# Patient Record
Sex: Female | Born: 1960 | Race: White | Hispanic: No | Smoking: Current every day smoker
Health system: Southern US, Community
[De-identification: ages and names within clinical notes are randomized; demographics above are authoritative.]

## PROBLEM LIST (undated history)

## (undated) DIAGNOSIS — E785 Hyperlipidemia, unspecified: Secondary | ICD-10-CM

## (undated) DIAGNOSIS — I10 Essential (primary) hypertension: Secondary | ICD-10-CM

## (undated) DIAGNOSIS — J45909 Unspecified asthma, uncomplicated: Secondary | ICD-10-CM

## (undated) DIAGNOSIS — T7840XA Allergy, unspecified, initial encounter: Secondary | ICD-10-CM

## (undated) HISTORY — PX: EYE SURGERY: SHX253

## (undated) HISTORY — DX: Unspecified asthma, uncomplicated: J45.909

## (undated) HISTORY — DX: Allergy, unspecified, initial encounter: T78.40XA

## (undated) HISTORY — PX: AUGMENTATION MAMMAPLASTY: SUR837

## (undated) HISTORY — DX: Hyperlipidemia, unspecified: E78.5

## (undated) HISTORY — DX: Essential (primary) hypertension: I10

---

## 2003-12-11 ENCOUNTER — Other Ambulatory Visit: Payer: Self-pay

## 2004-11-02 ENCOUNTER — Emergency Department: Payer: Self-pay | Admitting: Unknown Physician Specialty

## 2005-07-06 ENCOUNTER — Emergency Department: Payer: Self-pay | Admitting: Emergency Medicine

## 2006-01-01 ENCOUNTER — Inpatient Hospital Stay (HOSPITAL_COMMUNITY): Admission: AD | Admit: 2006-01-01 | Discharge: 2006-01-01 | Payer: Self-pay | Admitting: Obstetrics and Gynecology

## 2006-01-08 ENCOUNTER — Inpatient Hospital Stay (HOSPITAL_COMMUNITY): Admission: AD | Admit: 2006-01-08 | Discharge: 2006-01-08 | Payer: Self-pay | Admitting: Family Medicine

## 2006-01-19 ENCOUNTER — Other Ambulatory Visit: Admission: RE | Admit: 2006-01-19 | Discharge: 2006-01-19 | Payer: Self-pay | Admitting: Obstetrics and Gynecology

## 2006-01-19 ENCOUNTER — Ambulatory Visit: Payer: Self-pay | Admitting: Obstetrics and Gynecology

## 2006-01-28 ENCOUNTER — Ambulatory Visit (HOSPITAL_COMMUNITY): Admission: RE | Admit: 2006-01-28 | Discharge: 2006-01-28 | Payer: Self-pay | Admitting: Obstetrics and Gynecology

## 2006-12-07 ENCOUNTER — Ambulatory Visit: Payer: Self-pay | Admitting: *Deleted

## 2009-02-28 ENCOUNTER — Emergency Department: Payer: Self-pay | Admitting: Emergency Medicine

## 2014-05-26 ENCOUNTER — Emergency Department: Payer: Self-pay | Admitting: Emergency Medicine

## 2014-06-10 ENCOUNTER — Emergency Department: Payer: Self-pay | Admitting: Emergency Medicine
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
## 2016-06-28 ENCOUNTER — Emergency Department
Admission: EM | Admit: 2016-06-28 | Discharge: 2016-06-28 | Disposition: A | Discharge status: Home / Self Care | Payer: Self-pay | Attending: Emergency Medicine | Admitting: Emergency Medicine

## 2016-06-28 ENCOUNTER — Emergency Department: Payer: Self-pay

## 2016-06-28 ENCOUNTER — Encounter: Payer: Self-pay | Admitting: Emergency Medicine

## 2016-06-28 DIAGNOSIS — R0789 Other chest pain: Secondary | ICD-10-CM | POA: Insufficient documentation

## 2016-06-28 DIAGNOSIS — F17210 Nicotine dependence, cigarettes, uncomplicated: Secondary | ICD-10-CM | POA: Insufficient documentation

## 2016-06-28 LAB — BASIC METABOLIC PANEL
Anion gap: 7 (ref 5–15)
BUN: 8 mg/dL (ref 6–20)
CALCIUM: 9.4 mg/dL (ref 8.9–10.3)
CO2: 25 mmol/L (ref 22–32)
CREATININE: 0.88 mg/dL (ref 0.44–1.00)
Chloride: 106 mmol/L (ref 101–111)
GFR calc Af Amer: >60 mL/min (ref >60)
GLUCOSE: 157 mg/dL — AB (ref 65–99)
Potassium: 4.2 mmol/L (ref 3.5–5.1)
Sodium: 138 mmol/L (ref 135–145)

## 2016-06-28 LAB — CBC WITH DIFFERENTIAL/PLATELET
BASOS ABS: 0.1 10*3/uL (ref 0–0.1)
BASOS PCT: 1 %
EOS PCT: 5 %
Eosinophils Absolute: 0.6 10*3/uL (ref 0–0.7)
HEMATOCRIT: 56.3 % — AB (ref 35.0–47.0)
Hemoglobin: 19.5 g/dL — ABNORMAL HIGH (ref 12.0–16.0)
Lymphocytes Relative: 16 %
Lymphs Abs: 1.7 10*3/uL (ref 1.0–3.6)
MCH: 33.5 pg (ref 26.0–34.0)
MCHC: 34.7 g/dL (ref 32.0–36.0)
MCV: 96.7 fL (ref 80.0–100.0)
MONO ABS: 0.7 10*3/uL (ref 0.2–0.9)
MONOS PCT: 6 %
NEUTROS ABS: 7.6 10*3/uL — AB (ref 1.4–6.5)
Neutrophils Relative %: 72 %
PLATELETS: 240 10*3/uL (ref 150–440)
RBC: 5.82 MIL/uL — ABNORMAL HIGH (ref 3.80–5.20)
RDW: 13.1 % (ref 11.5–14.5)
WBC: 10.6 10*3/uL (ref 3.6–11.0)

## 2016-06-28 LAB — FIBRIN DERIVATIVES D-DIMER (ARMC ONLY): Fibrin derivatives D-dimer (ARMC): 391 (ref 0–499)

## 2016-06-28 LAB — TROPONIN I: Troponin I: <0.03 ng/mL (ref <0.03)

## 2016-06-28 MED ORDER — HYDROCODONE-ACETAMINOPHEN 5-325 MG PO TABS
1.0000 | ORAL_TABLET | Freq: Once | ORAL | Status: AC
  Administered 2016-06-28: 1 via ORAL
  Filled 2016-06-28: qty 1

## 2016-06-28 MED ORDER — HYDROCODONE-ACETAMINOPHEN 5-325 MG PO TABS: 1.0000 | ORAL_TABLET | Freq: Four times a day (QID) | ORAL | 0 refills | Status: DC | PRN

## 2016-06-28 MED ORDER — IBUPROFEN 400 MG PO TABS: 400.0000 mg | ORAL_TABLET | Freq: Four times a day (QID) | ORAL | 0 refills | Status: DC | PRN

## 2016-06-28 NOTE — Discharge Instructions (Signed)
Please return immediately if condition worsens. Please contact her primary physician or the physician you were given for referral. If you have any specialist physicians involved in her treatment and plan please also contact them. Thank you for using [facility]. ° °

## 2016-06-28 NOTE — ED Notes (Signed)
D&C IV. Tolerated well. 

## 2016-06-28 NOTE — ED Triage Notes (Signed)
Pt c/o acute onset left lung pain started last night, pt states she can't even take a deep breath in without sharp severe pain. Pt denies any cough or any other symptoms.

## 2016-06-28 NOTE — ED Provider Notes (Signed)
Time Seen: Approximately 1307  I have reviewed the triage notes  Chief Complaint: Breathing Problem (hurts to breathe)   History of Present Illness: Alexis Parks is a 55 y.o. female *who presents with acute onset starting last night of some left lateral pleuritic chest pain. Patient states the pain seems to be with deep inspiration and denies much in way of pain with movement. She denies any trauma or rashes. On further questioning that she was able to find a point tenderness over the left T4-T5 region. The patient denies any breast pain or masses. She denies any fever, chills with occasional dry nonproductive cough. She denies any back or flank pain. Outside of smoking she denies any pulmonary emboli risk factors. She denies any calf tenderness or swelling in the lower extremities   History reviewed. No pertinent past medical history.  There are no active problems to display for this patient.   History reviewed. No pertinent surgical history.  History reviewed. No pertinent surgical history.  Current Outpatient Rx  . Order #: [id number] Class: Print  . Order #: [id number] Class: Print    Allergies:  Review of patient's allergies indicates no known allergies.  Family History: No family history on file.  Social History: Social History  Substance Use Topics  . Smoking status: Current Every Day Smoker    Packs/day: 1.00    Years: 20.00    Types: Cigarettes  . Smokeless tobacco: Never Used     Comment: "I am trying to quit"  . Alcohol use Yes     Comment: only weekends     Review of Systems:   10 point review of systems was performed and was otherwise negative:  Constitutional: No fever Eyes: No visual disturbances ENT: No sore throat, ear pain Cardiac: Chest pain is left lateral posterior to the axillary area again T4-T5 Respiratory: No shortness of breath, wheezing, or stridor Abdomen: No abdominal pain, no vomiting, No diarrhea Endocrine: No weight loss, No  night sweats Extremities: No peripheral edema, cyanosis Skin: No rashes, easy bruising Neurologic: No focal weakness, trouble with speech or swollowing Urologic: No dysuria, Hematuria, or urinary frequency  Physical Exam:  ED Triage Vitals  Enc Vitals Group     BP 06/28/16 1003 (!) 148/95     Pulse Rate 06/28/16 1003 (!) 106     Resp 06/28/16 1003 18     Temp 06/28/16 1003 98 F (36.7 C)     Temp Source 06/28/16 1003 Oral     SpO2 06/28/16 1003 94 %     Weight 06/28/16 1004 125 lb (56.7 kg)     Height 06/28/16 1004 5\' 3"  (1.6 m)     Head Circumference --      Peak Flow --      Pain Score 06/28/16 1008 10     Pain Loc --      Pain Edu? --      Excl. in GC? --     General: Awake , Alert , and Oriented times 3; GCS 15 Head: Normal cephalic , atraumatic Eyes: Pupils equal , round, reactive to light Nose/Throat: No nasal drainage, patent upper airway without erythema or exudate.  Neck: Supple, Full range of motion, No anterior adenopathy or palpable thyroid masses Lungs: Clear to ascultation without wheezes , rhonchi, or rales Heart: Regular rate, regular rhythm without murmurs , gallops , or rubs Abdomen: Soft, non tender without rebound, guarding , or rigidity; bowel sounds positive and symmetric in all 4 quadrants. No organomegaly .  Extremities: 2 plus symmetric pulses. No edema, clubbing or cyanosis Neurologic: normal ambulation, Motor symmetric without deficits, sensory intact Skin: warm, dry, no rashes Pain is reproducible with palpation across the posterior rib cage without any visible rashes or lesions and no crepitus or step-off noted  Labs:   All laboratory work was reviewed including any pertinent negatives or positives listed below:  Labs Reviewed  CBC WITH DIFFERENTIAL/PLATELET - Abnormal; Notable for the following:       Result Value   RBC 5.82 (*)    Hemoglobin 19.5 (*)    HCT 56.3 (*)    Neutro Abs 7.6 (*)    All other components within normal  limits  BASIC METABOLIC PANEL - Abnormal; Notable for the following:    Glucose, Bld 157 (*)    All other components within normal limits  FIBRIN DERIVATIVES D-DIMER ([facility] ONLY)  TROPONIN I    EKG: * ED ECG REPORT I, Jennye Moccasin, the attending physician, personally viewed and interpreted this ECG.  Date: 06/28/2016 EKG Time: *1018 Rate: *90 Rhythm: normal sinus rhythm QRS Axis: Rightward axis Intervals: normal ST/T Wave abnormalities: Nonspecific ST-T wave abnormality versus artifact Conduction Disturbances: none Narrative Interpretation: unremarkable No acute ischemic changes   Radiology: "Dg Chest 2 View  Result Date: 06/28/2016 CLINICAL DATA:  55 year old female with history of left-sided chest pain, worsening during deep inspiration since yesterday. Smoker. History of COPD. EXAM: CHEST  2 VIEW COMPARISON:  Chest x-ray a 06/10/2014. FINDINGS: Lung volumes are normal. No consolidative airspace disease. No pleural effusions. No pneumothorax. No pulmonary nodule or mass noted. Pulmonary vasculature and the cardiomediastinal silhouette are within normal limits. Aortic atherosclerosis. Bilateral breast implants with capsular calcification incidentally noted. IMPRESSION: 1. No radiographic evidence of acute cardiopulmonary disease. 2. An Aortic atherosclerosis. Electronically Signed   By: Trudie Reed M.D.   On: 06/28/2016 10:47  "  I personally reviewed the radiologic studies    ED Course: Differential includes all life-threatening causes for chest pain. This includes but is not exclusive to acute coronary syndrome, aortic dissection, pulmonary embolism, cardiac tamponade, community-acquired pneumonia, pericarditis, musculoskeletal chest wall pain, etc.  Given the patient's clinical presentation and objective findings I felt this was musculoskeletal lateral chest discomfort. With a negative D-dimer test and a low clinical suspicion for pulmonary embolism I felt we could  stop the evaluation at this point due to his significant sensitivity of a D-dimer test. She also doesn't appear to have persistent symptoms and denies any shortness of breath and given the reproducible nature of her pain I felt was unlikely to be significant intrathoracic discomfort from acute pulmonary embolism, aortic dissection, cardiac tamponade, acute coronary syndrome etc. Patient was advised her workup is not complete and she should follow up with her primary physician. She is currently in between insurance and programs and does not have a primary physician nor insurance at this point. I referred her to [facility]. She was given prescription pain medication. Clinical Course     Assessment: Acute chest wall pain Final Clinical Impression:  Final diagnoses:  Acute chest wall pain     Plan: Outpatient " Discharge Medication List as of 06/28/2016  3:07 PM    START taking these medications   Details  HYDROcodone-acetaminophen (NORCO) 5-325 MG tablet Take 1 tablet by mouth every 6 (six) hours as needed for moderate pain., Starting Mon 06/28/2016, Print    ibuprofen (ADVIL,MOTRIN) 400 MG tablet Take 1 tablet (400 mg total) by mouth every  6 (six) hours as needed., Starting Mon 06/28/2016, Print      " Patient was advised to return immediately if condition worsens. Patient was advised to follow up with their primary care physician or other specialized physicians involved in their outpatient care. The patient and/or family member/power of attorney had laboratory results reviewed at the bedside. All questions and concerns were addressed and appropriate discharge instructions were distributed by the nursing staff.             Jennye MoccasinBrian S Quigley, MD 06/28/16 [phone]

## 2016-06-28 NOTE — ED Notes (Signed)
No changes in symptoms

## 2016-10-13 ENCOUNTER — Ambulatory Visit
Admission: RE | Admit: 2016-10-13 | Discharge: 2016-10-13 | Disposition: A | Discharge status: Home / Self Care | Payer: Self-pay | Source: Ambulatory Visit | Attending: Oncology | Admitting: Oncology

## 2016-10-13 ENCOUNTER — Other Ambulatory Visit: Payer: Self-pay | Admitting: *Deleted

## 2016-10-13 ENCOUNTER — Encounter: Payer: Self-pay | Admitting: *Deleted

## 2016-10-13 ENCOUNTER — Ambulatory Visit: Payer: Self-pay | Attending: Oncology | Admitting: *Deleted

## 2016-10-13 VITALS — BP 136/86 | HR 103 | Temp 98.6°F | Ht 64.96 in | Wt 132.8 lb

## 2016-10-13 DIAGNOSIS — N63 Unspecified lump in unspecified breast: Secondary | ICD-10-CM

## 2016-10-13 DIAGNOSIS — Z0000 Encounter for general adult medical examination without abnormal findings: Secondary | ICD-10-CM

## 2016-10-13 NOTE — Patient Instructions (Signed)
HPV Test The human papillomavirus (HPV) test is used to look for high-risk types of HPV infection. HPV is a group of about 100 viruses. Many of these viruses cause growths on, in, or around the genitals. Most HPV viruses cause infections that usually go away without treatment. However, HPV types 6, 11, 16, and 18 are considered high-risk types of HPV that can increase your risk of cancer of the cervix or anus if the infection is left untreated. An HPV test identifies the DNA (genetic) strands of the HPV infection, so it is also referred to as the HPV DNA test. Although HPV is found in both males and females, the HPV test is only used to screen for increased cancer risk in females:  With an abnormal Pap test.  After treatment of an abnormal Pap test.  Between the ages of 30 and 65.  After treatment of a high-risk HPV infection. The HPV test may be done at the same time as a pelvic exam and Pap test in females over the age of 30. Both the HPV test and Pap test require a sample of cells from the cervix. How do I prepare for this test?  Do not douche or take a bath for 24-48 hours before the test or as directed by your health care provider.  Do not have sex for 24-48 hours before the test or as directed by your health care provider.  You may be asked to reschedule the test if you are menstruating.  You will be asked to urinate before the test. What do the results mean? It is your responsibility to obtain your test results. Ask the lab or department performing the test when and how you will get your results. Talk with your health care provider if you have any questions about your results. Your result will be negative or positive. Meaning of Negative Test Results  A negative HPV test result means that no HPV was found, and it is very likely that you do not have HPV. Meaning of Positive Test Results  A positive HPV test result indicates that you have HPV.  If your test result shows the presence  of any high-risk HPV strains, you may have an increased risk of developing cancer of the cervix or anus if the infection is left untreated.  If any low-risk HPV strains are found, you are not likely to have an increased risk of cancer. Discuss your test results with your health care provider. He or she will use the results to make a diagnosis and determine a treatment plan that is right for you. Talk with your health care provider to discuss your results, treatment options, and if necessary, the need for more tests. Talk with your health care provider if you have any questions about your results. This information is not intended to replace advice given to you by your health care provider. Make sure you discuss any questions you have with your health care provider. Document Released: 10/01/2004 Document Revised: 05/12/2016 Document Reviewed: 01/22/2014 Elsevier Interactive Patient Education  2017 Elsevier Inc.  Gave patient hand-out, Women Staying Healthy, Active and Well from [facility], with education on breast health, pap smears, heart and colon health.  

## 2016-10-13 NOTE — Progress Notes (Signed)
Subjective:     Patient ID: Alexis Parks, female   DOB: 21-Apr-1961, 56 y.o.   MRN: [id number]  HPI   Review of Systems     Objective:   Physical Exam  Pulmonary/Chest: Right breast exhibits mass. Right breast exhibits no inverted nipple, no nipple discharge, no skin change and no tenderness. Left breast exhibits no inverted nipple, no mass, no nipple discharge, no skin change and no tenderness. Breasts are symmetrical.    Bilateral breast with implants       Assessment:     56 year old White female referred to [facility] by [facility] evaluation of a right breast mass.  Patient with family history of a maternal first cousin with ovarian cancer, and a brother with brain cancer.  Patient states she found the lump 3 months ago on self exam after noticing tenderness while putting on her bra.  States she has not noticed any changes in the last the 3 months.  On clinical breast exam I can palpate a tiny very tender, superficial, approximate < 0.5 cm nodule at 8:00 right breast.  The bilateral implants are palpable.  Patient states she has had the implants for 25 years.  Taught self breast awareness.  Specimen collected for pap smear without difficulty.  Patient has been screened for eligibility.  She does not have any insurance, Medicare or Medicaid.  She also meets financial eligibility.  Hand-out given on the Affordable Care Act.    Plan:     Bilateral diagnostic mammogram and ultrasound ordered.  Specimen for pap sent to the lab.  Will follow-up per [facility] protocol.

## 2016-10-15 ENCOUNTER — Telehealth: Payer: Self-pay | Admitting: *Deleted

## 2016-10-15 NOTE — Telephone Encounter (Signed)
Left patient a message to give me a call.  She had a normal mammogram result, but I would like to offer her a surgical consult for the palpable nodule in the right breast.

## 2016-10-19 LAB — PAP LB AND HPV HIGH-RISK
HPV, high-risk: NEGATIVE
PAP SMEAR COMMENT: 0

## 2016-10-20 ENCOUNTER — Ambulatory Visit (INDEPENDENT_AMBULATORY_CARE_PROVIDER_SITE_OTHER): Payer: PRIVATE HEALTH INSURANCE | Admitting: General Surgery

## 2016-10-20 ENCOUNTER — Inpatient Hospital Stay: Payer: Self-pay

## 2016-10-20 ENCOUNTER — Encounter: Payer: Self-pay | Admitting: General Surgery

## 2016-10-20 VITALS — BP 140/82 | HR 86 | Resp 14 | Ht 64.0 in | Wt 133.0 lb

## 2016-10-20 DIAGNOSIS — N6310 Unspecified lump in the right breast, unspecified quadrant: Secondary | ICD-10-CM

## 2016-10-20 NOTE — Progress Notes (Addendum)
Patient ID: [name], female   DOB: 10/23/1960, 56 y.o.   MRN: [id number]  Chief Complaint  Patient presents with  . Breast Problem    HPI Alexis Parks is a 56 y.o. female.  who presents for a breast evaluation referred by [facility]. The most recent mammogram and ultrasound was done on 10-13-16. She states she felt a lump in the right breast about 3 months ago. Admits to tenderness. She does not think it has changed in size, about a green pea. Patient does not perform regular self breast checks and does not gets regular mammograms done.   She is here with Alexis Parks her sister. I have reviewed the history of present illness with the patient.   HPI  Past Medical History:  Diagnosis Date  . Allergy    seasonal    Past Surgical History:  Procedure Laterality Date  . AUGMENTATION MAMMAPLASTY Bilateral 25+ yrs ago   silicone    Family History  Problem Relation Age of Onset  . Bone cancer Father   . Diabetes Maternal Grandfather     Social History Social History  Substance Use Topics  . Smoking status: Current Every Day Smoker    Packs/day: 1.00    Years: 20.00    Types: Cigarettes  . Smokeless tobacco: Never Used     Comment: "I am trying to quit"  . Alcohol use Yes     Comment: only weekends    No Known Allergies  Current Outpatient Prescriptions  Medication Sig Dispense Refill  . Aspirin Effervescent (ALKA-SELTZER EXTRA STRENGTH PO) Take by mouth as needed.    Marland Kitchen ibuprofen (ADVIL,MOTRIN) 400 MG tablet Take 1 tablet (400 mg total) by mouth every 6 (six) hours as needed. 30 tablet 0   No current facility-administered medications for this visit.     Review of Systems Review of Systems  Constitutional: Negative.   Respiratory: Negative.   Cardiovascular: Negative.     Blood pressure 140/82, pulse 86, resp. rate 14, height 5\' 4"  (1.626 m), weight 133 lb (60.3 kg).  Physical Exam Physical Exam  Constitutional: She is oriented to person, place, and time. She  appears well-developed and well-nourished.  Eyes: Conjunctivae are normal. No scleral icterus.  Neck: Neck supple.  Cardiovascular: Normal rate, regular rhythm and normal heart sounds.   Pulmonary/Chest: Effort normal and breath sounds normal. Right breast exhibits mass and tenderness. Right breast exhibits no inverted nipple, no nipple discharge and no skin change. Left breast exhibits no inverted nipple, no mass, no nipple discharge, no skin change and no tenderness.    Lymphadenopathy:    She has no cervical adenopathy.    She has no axillary adenopathy.  Neurological: She is alert and oriented to person, place, and time.  Skin: Skin is warm and dry.  Psychiatric: Her behavior is normal.    Data Reviewed Mammogram and ultrasound reviewed no findings in the area of patient's concern Assessment    5 mm mass right breast, located at 7-8 OCL position, 4 CMFN. Given stability and negative imaging this is likely a benign mass-possibly fat necrosis.  No apparent deformity of breast implants noted on physical exam.    Plan    Mass was not located on mammogram or ultrasound.  Safe to follow, pt advised  Instructed patient to monitor size of lump, and call back if it increases in size or becomes more painful. Follow-up in 3 months.     This information has been scribed by Dorathy Daft  RN, BSN,BC.   SANKAR,SEEPLAPUTHUR G 10/20/2016, 1:22 PM

## 2016-10-20 NOTE — Patient Instructions (Signed)
The patient is aware to call back for any questions or concerns.  

## 2016-10-26 ENCOUNTER — Encounter: Payer: Self-pay | Admitting: *Deleted

## 2016-10-26 NOTE — Progress Notes (Signed)
Probable benign findings per Dr. Evette CristalSankar.  Patient is to follow up with him in 3 months.  [facility] to [place].

## 2017-01-13 ENCOUNTER — Encounter: Payer: Self-pay | Admitting: General Surgery

## 2017-01-13 ENCOUNTER — Ambulatory Visit (INDEPENDENT_AMBULATORY_CARE_PROVIDER_SITE_OTHER): Payer: PRIVATE HEALTH INSURANCE | Admitting: General Surgery

## 2017-01-13 ENCOUNTER — Inpatient Hospital Stay: Payer: Self-pay

## 2017-01-13 VITALS — BP 128/78 | HR 82 | Resp 12 | Ht 64.0 in | Wt 123.0 lb

## 2017-01-13 DIAGNOSIS — N6310 Unspecified lump in the right breast, unspecified quadrant: Secondary | ICD-10-CM

## 2017-01-13 DIAGNOSIS — N6313 Unspecified lump in the right breast, lower outer quadrant: Secondary | ICD-10-CM | POA: Diagnosis not present

## 2017-01-13 NOTE — Progress Notes (Signed)
Patient ID: Alexis Parks, female   DOB: 11/28/1960, 56 y.o.   MRN: [id number]  Chief Complaint  Patient presents with  . Follow-up    breast mass    HPI Alexis Parks is a 56 y.o. female.  Here today for follow up right breast mass. She states it has not changed in size, but it is tender. She currently has a sinus headache.  HPI  Past Medical History:  Diagnosis Date  . Allergy    seasonal    Past Surgical History:  Procedure Laterality Date  . AUGMENTATION MAMMAPLASTY Bilateral 25+ yrs ago   silicone    Family History  Problem Relation Age of Onset  . Bone cancer Father   . Diabetes Maternal Grandfather     Social History Social History  Substance Use Topics  . Smoking status: Current Every Day Smoker    Packs/day: 1.00    Years: 20.00    Types: Cigarettes  . Smokeless tobacco: Never Used     Comment: "I am trying to quit"  . Alcohol use Yes     Comment: only weekends    No Known Allergies  Current Outpatient Prescriptions  Medication Sig Dispense Refill  . Aspirin Effervescent (ALKA-SELTZER EXTRA STRENGTH PO) Take by mouth as needed.    Marland Kitchen ibuprofen (ADVIL,MOTRIN) 400 MG tablet Take 1 tablet (400 mg total) by mouth every 6 (six) hours as needed. 30 tablet 0   No current facility-administered medications for this visit.     Review of Systems Review of Systems  Constitutional: Negative.   Respiratory: Negative.   Cardiovascular: Negative.   Neurological: Positive for headaches.    Blood pressure 128/78, pulse 82, resp. rate 12, height  (1.626 m), weight 123 lb (55.8 kg).  Physical Exam Physical Exam  Constitutional: She is oriented to person, place, and time. She appears well-developed and well-nourished.  Pulmonary/Chest: Right breast exhibits mass and tenderness. Right breast exhibits no inverted nipple, no nipple discharge and no skin change. Left breast exhibits no inverted nipple, no mass, no nipple discharge, no skin change and no  tenderness.    Lymphadenopathy:    She has no axillary adenopathy.  Neurological: She is alert and oriented to person, place, and time.  Skin: Skin is warm.  Psychiatric: Her behavior is normal.    Data Reviewed Prior notes reviewed Mammogram reviewed - stable Targeted ultrasound showed some irregularity of the right breast implant over the area of tenderness, 7-8 o'clock position, 4 CMFN. No mass/cyst was identified.  Assessment    5 mm mass right breast, located at 7-8 o'clock position, 4 CMFN. This remains unchanged. Ultrasound showed no apparent abnormality of the underlying tissue, but there is some surface irregularity of the implant. Patient advised, no need for further assessment or intervention.     Plan        Follow up as needed. Screening mammogram next year via [facility].  The patient is aware to call back for any questions or concerns.   HPI, Physical Exam, Assessment and Plan have been scribed under the direction and in the presence of Kathreen Cosier, MD  Dorathy Daft, RN  I have completed the exam and reviewed the above documentation for accuracy and completeness.  I agree with the above.  Museum/gallery conservator has been used and any errors in dictation or transcription are unintentional.  Kaloni Bisaillon G. Evette Cristal, M.D., F.A.C.S.  Gerlene Burdock G 01/13/2017, 3:33 PM

## 2017-01-13 NOTE — Patient Instructions (Signed)
The patient is aware to call back for any questions or concerns.  

## 2017-12-14 ENCOUNTER — Other Ambulatory Visit: Payer: Self-pay

## 2017-12-14 ENCOUNTER — Ambulatory Visit
Admission: RE | Admit: 2017-12-14 | Discharge: 2017-12-14 | Disposition: A | Discharge status: Home / Self Care | Payer: Self-pay | Source: Ambulatory Visit | Attending: Oncology | Admitting: Oncology

## 2017-12-14 ENCOUNTER — Ambulatory Visit: Payer: Self-pay | Attending: Oncology

## 2017-12-14 VITALS — BP 122/77 | HR 81 | Temp 98.3°F | Ht 65.0 in | Wt 125.0 lb

## 2017-12-14 DIAGNOSIS — Z0000 Encounter for general adult medical examination without abnormal findings: Secondary | ICD-10-CM

## 2017-12-14 NOTE — Progress Notes (Signed)
Subjective:     Patient ID: Alexis Parks, female   DOB: 07/10/1961, 57 y.o.   MRN: [id number]  HPI   Review of Systems     Objective:   Physical Exam  Pulmonary/Chest: Right breast exhibits no inverted nipple, no mass, no nipple discharge, no skin change and no tenderness. Left breast exhibits no inverted nipple, no mass, no nipple discharge, no skin change and no tenderness. Breasts are symmetrical.  Bilateral breast implants       Assessment:     57 [facility].  Patient screened, and meets [facility] eligibility.  Patient does not have insurance, Medicare or Medicaid.  Handout given on Affordable Care Act. Instructed patient on breast self awareness using teach back method. Per patient, she has had bilateral breast implants for 30 years.  CBE unremarkable. No mass or lump palpated.  She was followed by Dr. Evette CristalSankar  In 2018 for stable right breast nodule.    Plan:     Sent for bilateral screening mammogram.

## 2017-12-15 NOTE — Progress Notes (Signed)
Letter mailed from [facility] to notify of normal mammogram results.  Patient to return in one year for annual screening.  Copy to [facility]. 

## 2018-01-30 IMAGING — CR DG CHEST 2V
1 series · 2 of 2 positions shown · non-contrast
Comparison: Chest x-ray a 06/10/2014.

CLINICAL DATA: 55-year-old female with history of left-sided chest
pain, worsening during deep inspiration since yesterday. Smoker.
History of COPD.

EXAM:
CHEST  2 VIEW

[Series 1: w chest pa · 0.14mm/px · 2 of 2 slices shown]
[im 1/2]
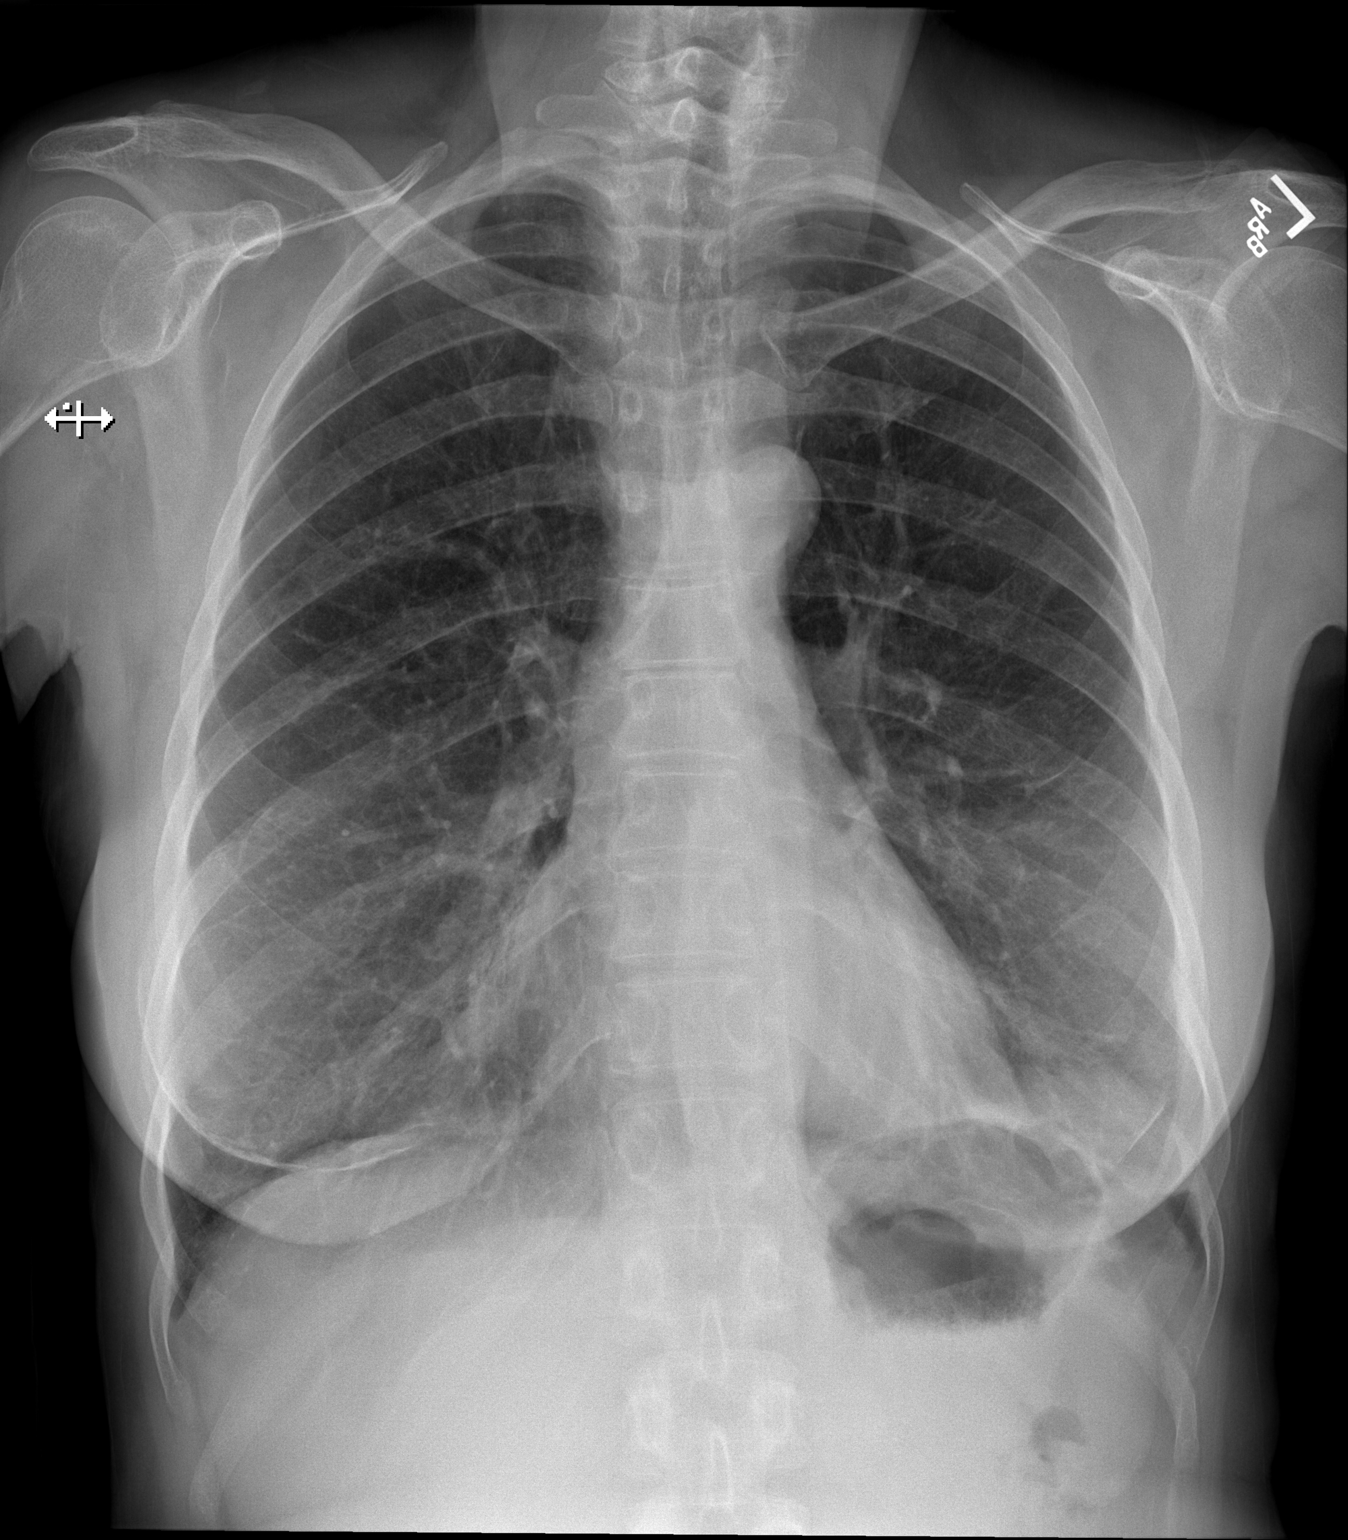
[im 2/2]
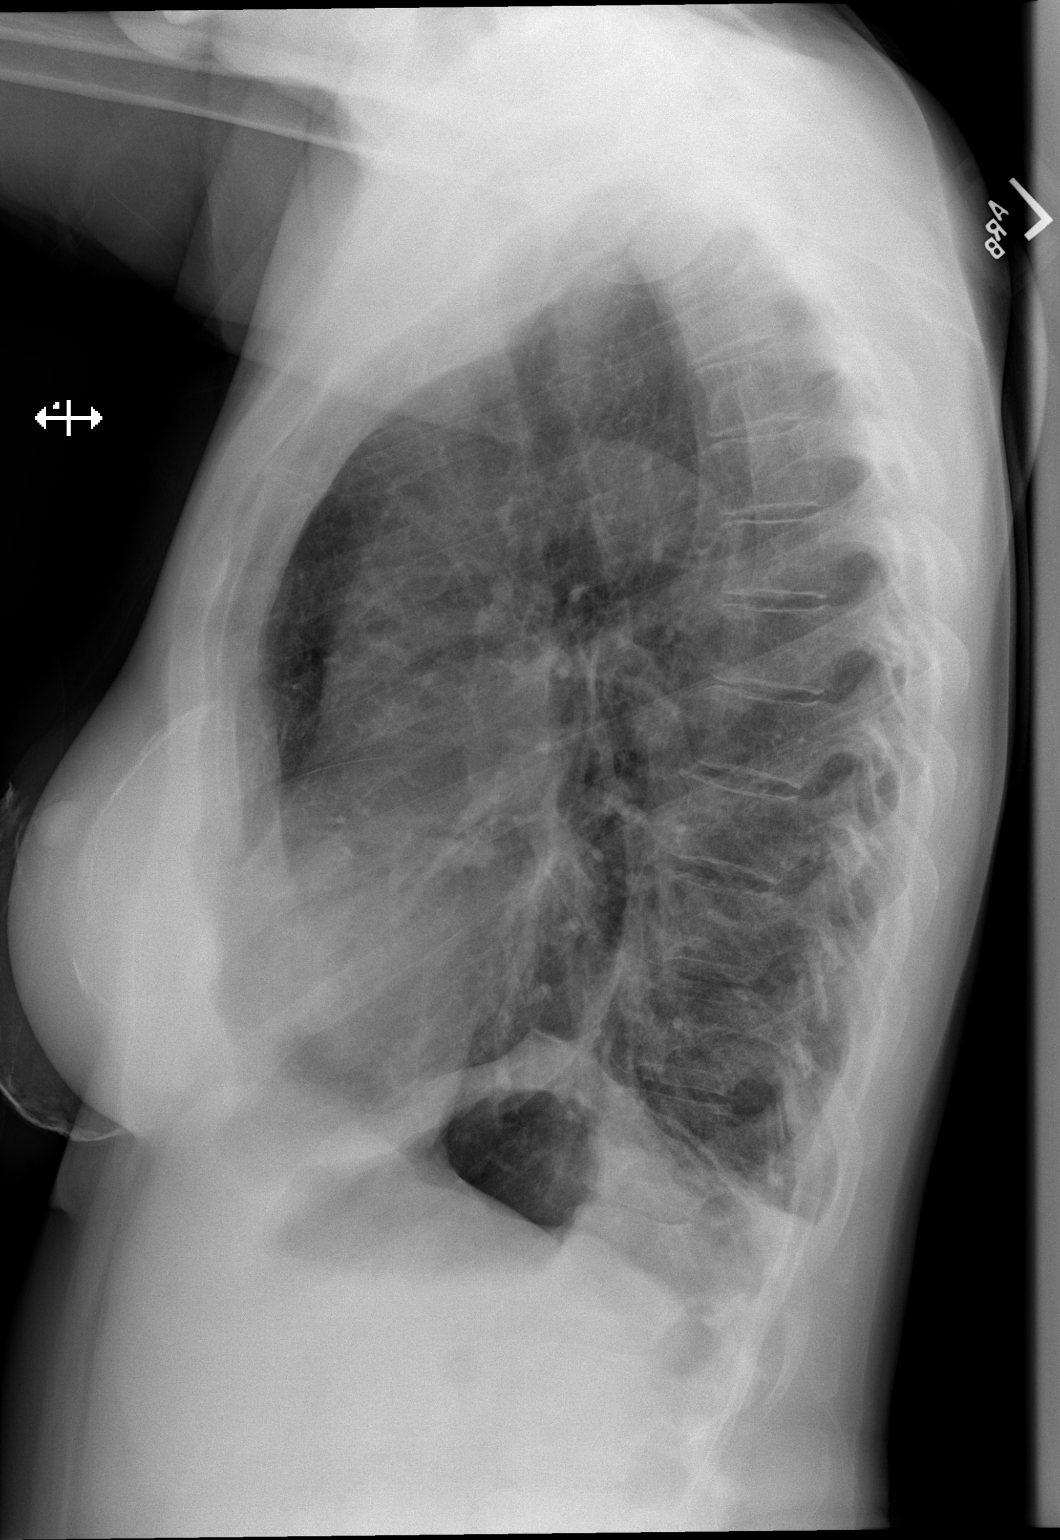

[2 of 2 positions shown; findings below may reference images not displayed]

FINDINGS: Lung volumes are normal. No consolidative airspace disease. No
pleural effusions. No pneumothorax. No pulmonary nodule or mass
noted. Pulmonary vasculature and the cardiomediastinal silhouette
are within normal limits. Aortic atherosclerosis. Bilateral breast
implants with capsular calcification incidentally noted.
IMPRESSION: 1. No radiographic evidence of acute cardiopulmonary disease.
2. An Aortic atherosclerosis.

## 2019-04-05 ENCOUNTER — Other Ambulatory Visit: Payer: Self-pay

## 2019-04-05 DIAGNOSIS — Z20822 Contact with and (suspected) exposure to covid-19: Secondary | ICD-10-CM

## 2019-04-10 LAB — NOVEL CORONAVIRUS, NAA: SARS-CoV-2, NAA: NOT DETECTED

## 2019-07-13 ENCOUNTER — Other Ambulatory Visit: Payer: Self-pay

## 2019-07-13 DIAGNOSIS — Z20822 Contact with and (suspected) exposure to covid-19: Secondary | ICD-10-CM

## 2019-07-15 LAB — NOVEL CORONAVIRUS, NAA: SARS-CoV-2, NAA: NOT DETECTED

## 2020-05-16 ENCOUNTER — Emergency Department: Payer: Self-pay

## 2020-05-16 ENCOUNTER — Emergency Department
Admission: EM | Admit: 2020-05-16 | Discharge: 2020-05-16 | Disposition: A | Discharge status: Home / Self Care | Payer: Self-pay | Attending: Emergency Medicine | Admitting: Emergency Medicine

## 2020-05-16 ENCOUNTER — Other Ambulatory Visit: Payer: Self-pay

## 2020-05-16 DIAGNOSIS — Z5321 Procedure and treatment not carried out due to patient leaving prior to being seen by health care provider: Secondary | ICD-10-CM | POA: Insufficient documentation

## 2020-05-16 DIAGNOSIS — R0602 Shortness of breath: Secondary | ICD-10-CM | POA: Insufficient documentation

## 2020-05-16 DIAGNOSIS — R05 Cough: Secondary | ICD-10-CM | POA: Insufficient documentation

## 2020-05-16 DIAGNOSIS — R82998 Other abnormal findings in urine: Secondary | ICD-10-CM | POA: Insufficient documentation

## 2020-05-16 LAB — URINALYSIS, COMPLETE (UACMP) WITH MICROSCOPIC
Bilirubin Urine: NEGATIVE
Glucose, UA: NEGATIVE mg/dL
Hgb urine dipstick: NEGATIVE
Ketones, ur: NEGATIVE mg/dL
Leukocytes,Ua: NEGATIVE
Nitrite: NEGATIVE
Protein, ur: NEGATIVE mg/dL
Specific Gravity, Urine: 1.003 — ABNORMAL LOW (ref 1.005–1.030)
Squamous Epithelial / HPF: NONE SEEN (ref 0–5)
pH: 6 (ref 5.0–8.0)

## 2020-05-16 NOTE — ED Notes (Addendum)
This RN was walking to pod D waiting area when pt and husband were talking towards discharge doors. This RN asked pt where she was going and pt stated, "I'm leaving, I'm done waiting." PA made aware. Unable to obtain VS or signature prior to leaving

## 2020-05-16 NOTE — ED Notes (Signed)
Pt states that she has had a cough, congestion of her check, n/v and some dark urine in the morning. Pt denies fevers.

## 2020-05-16 NOTE — ED Notes (Addendum)
Patient to waiting room ambulatory via [facility] from home.  Per EMS patient complains of shortness of six months, flank pain and dark urine for the past 2 months.  Per EMS vital signs are within normal limits.

## 2020-05-16 NOTE — ED Triage Notes (Signed)
Pt states has had an intermittent productive cough for months following covid infection. Pt states she is also concerned her urine is discolored for months. Pt denies pain. Appears in no acute distress.

## 2020-05-16 NOTE — ED Notes (Signed)
X-ray called patient with no answer.

## 2020-06-25 ENCOUNTER — Other Ambulatory Visit: Payer: Self-pay

## 2020-06-25 ENCOUNTER — Ambulatory Visit: Payer: Self-pay | Admitting: Gerontology

## 2020-06-25 ENCOUNTER — Other Ambulatory Visit: Payer: Self-pay | Admitting: Gerontology

## 2020-06-25 ENCOUNTER — Encounter: Payer: Self-pay | Admitting: Gerontology

## 2020-06-25 VITALS — BP 129/83 | HR 95 | Resp 18 | Ht 64.0 in | Wt 113.4 lb

## 2020-06-25 DIAGNOSIS — F17200 Nicotine dependence, unspecified, uncomplicated: Secondary | ICD-10-CM | POA: Insufficient documentation

## 2020-06-25 DIAGNOSIS — R059 Cough, unspecified: Secondary | ICD-10-CM | POA: Insufficient documentation

## 2020-06-25 DIAGNOSIS — I5022 Chronic systolic (congestive) heart failure: Secondary | ICD-10-CM

## 2020-06-25 DIAGNOSIS — Z8709 Personal history of other diseases of the respiratory system: Secondary | ICD-10-CM

## 2020-06-25 DIAGNOSIS — R5383 Other fatigue: Secondary | ICD-10-CM | POA: Insufficient documentation

## 2020-06-25 DIAGNOSIS — H538 Other visual disturbances: Secondary | ICD-10-CM | POA: Insufficient documentation

## 2020-06-25 DIAGNOSIS — Z7689 Persons encountering health services in other specified circumstances: Secondary | ICD-10-CM | POA: Insufficient documentation

## 2020-06-25 DIAGNOSIS — R399 Unspecified symptoms and signs involving the genitourinary system: Secondary | ICD-10-CM | POA: Insufficient documentation

## 2020-06-25 DIAGNOSIS — R2242 Localized swelling, mass and lump, left lower limb: Secondary | ICD-10-CM

## 2020-06-25 MED ORDER — FLUTICASONE-SALMETEROL 250-50 MCG/DOSE IN AEPB: 1.0000 | INHALATION_SPRAY | Freq: Every day | RESPIRATORY_TRACT | 3 refills | Status: DC

## 2020-06-25 MED ORDER — FEXOFENADINE HCL 30 MG PO TBDP: 30.0000 mg | ORAL_TABLET | Freq: Every day | ORAL | 3 refills | Status: DC

## 2020-06-25 MED ORDER — ALBUTEROL SULFATE HFA 108 (90 BASE) MCG/ACT IN AERS: 2.0000 | INHALATION_SPRAY | Freq: Four times a day (QID) | RESPIRATORY_TRACT | 3 refills | Status: DC | PRN

## 2020-06-25 NOTE — Progress Notes (Signed)
Patient ID: Alexis Parks, female   DOB: October 04, 1960, 59 y.o.   MRN: [id number]  Chief Complaint  Patient presents with  . Annual Exam    first visit    HPI Alexis Parks is a 59 y.o. female presenting today to establish care and address her medical problems. She states that she takes no meds daily. She reports a history of asthma diagnosed more than 15 years ago and was on albuterol and Allegra. She states that her symptoms were mostly controlled on those medications. She reports chest congestion and cough for "at least the last 4 months." Reports coughing up thick mucous that sometimes makes her gag. Denies post nasal drip or rhinorrhea. She received a chest X ray a the Emergency Department on 05/16/2020 that showed COPD without acute abnormality. She reports that she smokes a pack a day and is not ready to quit. Denies shortness of breath at rest, but states that she occasionally feels short of breath on exertion. She states that these symptoms are stable for her. She also reports that she thinks she has a bladder infection. She reports intermittent sharp pain with urination and reports that her urine is dark. Denies known hematuria or suprapubic pain. She states that she does not drink much water and drinks primarily [facility]. She reports a hx of cataract surgery in her R eye and reports that over the last few years that same eye has started to become blurry again. She denies pain or vision loss. States that L eye is unaffected. She states that she has been fatigued for more than 4 months now since she had COVID. She reports that she sleeps well at night and always gets at least 7 hours of sleep. She also reports a lump on her lower L shin for the last 8 months. States that it is tender. Denies injury. Lump is soft and spongy to palpation. Not easily moveable or discolored. Overall, patient states that she is doing well and offers no further complaint.   HPI  Past Medical History:    Diagnosis Date  . Allergy    seasonal  . Asthma     Past Surgical History:  Procedure Laterality Date  . AUGMENTATION MAMMAPLASTY Bilateral 53+ yrs ago   silicone  . EYE SURGERY     cataract surgery    Family History  Problem Relation Age of Onset  . Bone cancer Father   . Diabetes Maternal Grandfather     Social History Social History   Tobacco Use  . Smoking status: Current Every Day Smoker    Packs/day: 1.00    Years: 20.00    Pack years: 20.00    Types: Cigarettes  . Smokeless tobacco: Never Used  . Tobacco comment: "I am trying to quit"  Substance Use Topics  . Alcohol use: Yes    Alcohol/week: 2.0 - 3.0 standard drinks    Types: 2 - 3 Cans of beer per week    Comment: daily  . Drug use: No    No Known Allergies  Current Outpatient Medications  Medication Sig Dispense Refill  . acetaminophen (TYLENOL) 325 MG tablet Take 650 mg by mouth as needed for mild pain.    . Aspirin Effervescent (ALKA-SELTZER EXTRA STRENGTH PO) Take by mouth as needed.    Marland Kitchen dextromethorphan-guaiFENesin (MUCINEX DM) 30-600 MG 12hr tablet Take 1 tablet by mouth 2 (two) times daily as needed for cough.    Marland Kitchen ibuprofen (ADVIL,MOTRIN) 400 MG tablet Take 1 tablet (  400 mg total) by mouth every 6 (six) hours as needed. (Patient not taking: Reported on 06/25/2020) 30 tablet 0   No current facility-administered medications for this visit.    Review of Systems Review of Systems  Constitutional: Positive for fatigue.  HENT: Positive for congestion.   Eyes: Positive for visual disturbance.  Respiratory: Positive for cough.   Cardiovascular: Negative.   Gastrointestinal: Negative.   Genitourinary: Positive for dysuria and flank pain.  Musculoskeletal: Negative for arthralgias, gait problem and joint swelling.  Skin: Negative for color change and rash.       "bump on L shin"  Neurological: Negative.   Hematological: Negative.   Psychiatric/Behavioral: Negative.     Blood pressure  129/83, pulse 95, resp. rate 18, height _0  (1.626 m), weight 113 lb 6.4 oz (51.4 kg), SpO2 95 %.  Physical Exam Physical Exam Constitutional:      Appearance: Normal appearance.  HENT:     Head: Normocephalic.  Eyes:     Pupils: Pupils are equal, round, and reactive to light.  Cardiovascular:     Rate and Rhythm: Normal rate and regular rhythm.     Heart sounds: Normal heart sounds.  Pulmonary:     Effort: Pulmonary effort is normal.     Breath sounds: Normal breath sounds.  Abdominal:     General: Abdomen is flat. Bowel sounds are normal.     Palpations: Abdomen is soft.     Tenderness: There is right CVA tenderness and left CVA tenderness.  Musculoskeletal:        General: Normal range of motion.  Skin:    General: Skin is warm and dry.       Neurological:     General: No focal deficit present.     Mental Status: She is alert and oriented to person, place, and time.  Psychiatric:        Mood and Affect: Mood normal.        Behavior: Behavior normal.        Thought Content: Thought content normal.        Judgment: Judgment normal.    Assessment and Plan  1. Encounter to establish care Labs drawn today. Return 07/09/20 to review. Schedule colonoscopy and mammogram. Referrals provided.  - CBC w/Diff - Comp Met (CMET) - Lipid Profile - HgB A1c - Vitamin D (25 hydroxy) - TSH - B12 and Folate Panel - Ambulatory referral to Hematology / Oncology - Ambulatory referral to Gastroenterology  2. History of asthma Albuterol inhaler and Allegra refilled.  Avoid asthma triggers when possible. - albuterol (VENTOLIN HFA) 108 (90 Base) MCG/ACT inhaler; Inhale 2 puffs into the lungs every 6 (six) hours as needed for wheezing or shortness of breath.  Dispense: 18 g; Refill: 3 - fexofenadine (ALLEGRA ODT) 30 MG disintegrating tablet; Take 1 tablet (30 mg total) by mouth daily.  Dispense: 30 tablet; Refill: 3  3. Smoking Encouraged to quit smoking. Referral provided for low  dose CT lung cancer screening.  - Ambulatory referral to Hematology / Oncology  4. Fatigue, unspecified type Continue to rest when needed. Increase water intake. Eat a diet rich in lean meats, fruits and vegetables and whole grains.  Will follow up on lab results.  - CBC w/Diff - Vitamin D (25 hydroxy) - TSH  5. Symptoms of urinary tract infection Increase water intake. Will follow up on urinalysis results. - UA/M w/rflx Culture, Routine  6. Cough Continue OTC Mucinex for chest congestion and productive cough.  7. Lump of skin of lower extremity, left Monitor lump for increased pain, redness, or size change.  - Ambulatory referral to General Surgery  8. Blurry vision, right eye Make an appointment with [facility] Ophthalmology. Monitor for sudden vision loss, intense pain, or new or worsening symptoms.  - Ambulatory referral to Ophthalmology  9. History of COPD Start Advair daily as prescribed. Encouraged to stop smoking. - Fluticasone-Salmeterol (ADVAIR) 250-50 MCG/DOSE AEPB; Inhale 1 puff into the lungs daily.  Dispense: 60 each; Refill: 3  Return in about 2 weeks (around 07/09/2020), or if symptoms worsen or fail to improve.  Maylon Cos Jabe Jeanbaptiste 06/25/2020, 11:40 AM

## 2020-06-28 LAB — COMPREHENSIVE METABOLIC PANEL
ALT: 30 IU/L (ref 0–32)
AST: 51 IU/L — ABNORMAL HIGH (ref 0–40)
Albumin/Globulin Ratio: 1.4 (ref 1.2–2.2)
Albumin: 4.4 g/dL (ref 3.8–4.9)
Alkaline Phosphatase: 147 IU/L — ABNORMAL HIGH (ref 44–121)
BUN/Creatinine Ratio: 8 — ABNORMAL LOW (ref 9–23)
BUN: 6 mg/dL (ref 6–24)
Bilirubin Total: 0.5 mg/dL (ref 0.0–1.2)
CO2: 23 mmol/L (ref 20–29)
Calcium: 10.6 mg/dL — ABNORMAL HIGH (ref 8.7–10.2)
Chloride: 105 mmol/L (ref 96–106)
Creatinine, Ser: 0.79 mg/dL (ref 0.57–1.00)
GFR calc Af Amer: 96 mL/min/{1.73_m2} (ref >59)
GFR calc non Af Amer: 83 mL/min/{1.73_m2} (ref >59)
Globulin, Total: 3.2 g/dL (ref 1.5–4.5)
Glucose: 94 mg/dL (ref 65–99)
Potassium: 5.6 mmol/L — ABNORMAL HIGH (ref 3.5–5.2)
Sodium: 144 mmol/L (ref 134–144)
Total Protein: 7.6 g/dL (ref 6.0–8.5)

## 2020-06-28 LAB — CBC WITH DIFFERENTIAL/PLATELET
Basophils Absolute: 0.1 10*3/uL (ref 0.0–0.2)
Basos: 1 %
EOS (ABSOLUTE): 0.2 10*3/uL (ref 0.0–0.4)
Eos: 3 %
Hematocrit: 49 % — ABNORMAL HIGH (ref 34.0–46.6)
Hemoglobin: 17 g/dL — ABNORMAL HIGH (ref 11.1–15.9)
Immature Grans (Abs): 0 10*3/uL (ref 0.0–0.1)
Immature Granulocytes: 0 %
Lymphocytes Absolute: 1.7 10*3/uL (ref 0.7–3.1)
Lymphs: 20 %
MCH: 35.2 pg — ABNORMAL HIGH (ref 26.6–33.0)
MCHC: 34.7 g/dL (ref 31.5–35.7)
MCV: 101 fL — ABNORMAL HIGH (ref 79–97)
Monocytes Absolute: 0.7 10*3/uL (ref 0.1–0.9)
Monocytes: 9 %
Neutrophils Absolute: 5.7 10*3/uL (ref 1.4–7.0)
Neutrophils: 67 %
Platelets: 320 10*3/uL (ref 150–450)
RBC: 4.83 x10E6/uL (ref 3.77–5.28)
RDW: 11.6 % — ABNORMAL LOW (ref 11.7–15.4)
WBC: 8.4 10*3/uL (ref 3.4–10.8)

## 2020-06-28 LAB — HEMOGLOBIN A1C
Est. average glucose Bld gHb Est-mCnc: 97 mg/dL
Hgb A1c MFr Bld: 5 % (ref 4.8–5.6)

## 2020-06-28 LAB — LIPID PANEL
Chol/HDL Ratio: 2.3 ratio (ref 0.0–4.4)
Cholesterol, Total: 216 mg/dL — ABNORMAL HIGH (ref 100–199)
HDL: 94 mg/dL (ref >39)
LDL Chol Calc (NIH): 106 mg/dL — ABNORMAL HIGH (ref 0–99)
Triglycerides: 95 mg/dL (ref 0–149)
VLDL Cholesterol Cal: 16 mg/dL (ref 5–40)

## 2020-06-28 LAB — VITAMIN D 25 HYDROXY (VIT D DEFICIENCY, FRACTURES): Vit D, 25-Hydroxy: 25.7 ng/mL — ABNORMAL LOW (ref 30.0–100.0)

## 2020-06-28 LAB — UA/M W/RFLX CULTURE, ROUTINE

## 2020-06-28 LAB — B12 AND FOLATE PANEL
Folate: 11.6 ng/mL (ref >3.0)
Vitamin B-12: 1129 pg/mL (ref 232–1245)

## 2020-06-28 LAB — TSH: TSH: 1.12 u[IU]/mL (ref 0.450–4.500)

## 2020-07-01 ENCOUNTER — Other Ambulatory Visit: Payer: Self-pay

## 2020-07-01 ENCOUNTER — Ambulatory Visit: Payer: Self-pay | Admitting: Pharmacy Technician

## 2020-07-01 ENCOUNTER — Telehealth: Payer: Self-pay

## 2020-07-01 DIAGNOSIS — Z79899 Other long term (current) drug therapy: Secondary | ICD-10-CM

## 2020-07-01 NOTE — Progress Notes (Signed)
Patient rescheduled for July 02, 2020 at 11:00a.m. to come in to [facility] to sign contract, [facility] Attestation, patient advocate letter and PAP applications.  Sherilyn Dacosta Care Manager [facility]

## 2020-07-01 NOTE — Telephone Encounter (Signed)
Contacted patient for lung CT screening program based on referral from Lexine Baton at [employer] [facility].  Message left with patient to call Glenna Fellows, lung navigator to schedule CT scan.

## 2020-07-02 ENCOUNTER — Other Ambulatory Visit: Payer: Self-pay | Admitting: Gerontology

## 2020-07-02 ENCOUNTER — Ambulatory Visit: Payer: Self-pay

## 2020-07-02 ENCOUNTER — Other Ambulatory Visit: Payer: Self-pay

## 2020-07-02 DIAGNOSIS — Z7689 Persons encountering health services in other specified circumstances: Secondary | ICD-10-CM

## 2020-07-03 ENCOUNTER — Other Ambulatory Visit: Payer: Self-pay | Admitting: Gerontology

## 2020-07-03 DIAGNOSIS — N390 Urinary tract infection, site not specified: Secondary | ICD-10-CM

## 2020-07-03 MED ORDER — NITROFURANTOIN MONOHYD MACRO 100 MG PO CAPS: 100.0000 mg | ORAL_CAPSULE | Freq: Two times a day (BID) | ORAL | 0 refills | Status: DC

## 2020-07-04 ENCOUNTER — Ambulatory Visit: Payer: Self-pay | Admitting: Pharmacy Technician

## 2020-07-04 ENCOUNTER — Other Ambulatory Visit: Payer: Self-pay

## 2020-07-04 DIAGNOSIS — Z79899 Other long term (current) drug therapy: Secondary | ICD-10-CM

## 2020-07-04 LAB — UA/M W/RFLX CULTURE, ROUTINE
Bilirubin, UA: NEGATIVE
Glucose, UA: NEGATIVE
Ketones, UA: NEGATIVE
Nitrite, UA: POSITIVE — AB
Protein,UA: NEGATIVE
RBC, UA: NEGATIVE
Specific Gravity, UA: 1.016 (ref 1.005–1.030)
Urobilinogen, Ur: 0.2 mg/dL (ref 0.2–1.0)
pH, UA: 5 (ref 5.0–7.5)

## 2020-07-04 LAB — MICROSCOPIC EXAMINATION
Casts: NONE SEEN /lpf
RBC, Urine: NONE SEEN /hpf (ref 0–2)

## 2020-07-04 LAB — URINE CULTURE, REFLEX

## 2020-07-04 NOTE — Progress Notes (Signed)
Completed [facility] application and contract.  Patient agreed to all terms of the [facility] contract.    Patient approved to receive medication assistance at [facility] until time for re-certification, and as long as eligibility criteria continues to be met.    Provided patient with community resource material based on her particular needs.    Advair and ProAir Prescription Applications completed with patient.  Forwarded to[facility] for signature.  Upon receipt of signed applications from provider, Advair and ProAir Prescription Applications will be submitted to [place] and [employer].  [facility]

## 2020-07-08 ENCOUNTER — Ambulatory Visit: Payer: PRIVATE HEALTH INSURANCE | Admitting: General Surgery

## 2020-07-08 ENCOUNTER — Other Ambulatory Visit: Payer: Self-pay | Admitting: Gerontology

## 2020-07-08 DIAGNOSIS — N390 Urinary tract infection, site not specified: Secondary | ICD-10-CM

## 2020-07-08 MED ORDER — NITROFURANTOIN MONOHYD MACRO 100 MG PO CAPS: 100.0000 mg | ORAL_CAPSULE | Freq: Two times a day (BID) | ORAL | 0 refills | Status: DC

## 2020-07-09 ENCOUNTER — Ambulatory Visit: Payer: Self-pay | Admitting: Gerontology

## 2020-07-09 ENCOUNTER — Encounter: Payer: Self-pay | Admitting: Gerontology

## 2020-07-09 ENCOUNTER — Other Ambulatory Visit: Payer: Self-pay | Admitting: Gerontology

## 2020-07-09 ENCOUNTER — Other Ambulatory Visit: Payer: Self-pay

## 2020-07-09 VITALS — BP 128/83 | HR 96 | Resp 18 | Wt 114.1 lb

## 2020-07-09 DIAGNOSIS — N3000 Acute cystitis without hematuria: Secondary | ICD-10-CM

## 2020-07-09 DIAGNOSIS — Z7689 Persons encountering health services in other specified circumstances: Secondary | ICD-10-CM

## 2020-07-09 DIAGNOSIS — T7840XD Allergy, unspecified, subsequent encounter: Secondary | ICD-10-CM

## 2020-07-09 DIAGNOSIS — J302 Other seasonal allergic rhinitis: Secondary | ICD-10-CM | POA: Insufficient documentation

## 2020-07-09 DIAGNOSIS — E785 Hyperlipidemia, unspecified: Secondary | ICD-10-CM

## 2020-07-09 DIAGNOSIS — E559 Vitamin D deficiency, unspecified: Secondary | ICD-10-CM

## 2020-07-09 DIAGNOSIS — T7840XA Allergy, unspecified, initial encounter: Secondary | ICD-10-CM | POA: Insufficient documentation

## 2020-07-09 MED ORDER — VITAMIN D (ERGOCALCIFEROL) 1.25 MG (50000 UNIT) PO CAPS: 50000.0000 [IU] | ORAL_CAPSULE | ORAL | 0 refills | Status: DC

## 2020-07-09 MED ORDER — FLUTICASONE PROPIONATE 50 MCG/ACT NA SUSP: 1.0000 | Freq: Every day | NASAL | 0 refills | Status: DC

## 2020-07-09 MED ORDER — CETIRIZINE HCL 5 MG PO TABS: 5.0000 mg | ORAL_TABLET | Freq: Every day | ORAL | 0 refills | Status: DC

## 2020-07-09 NOTE — Progress Notes (Signed)
Established Patient Office Visit  Subjective:  Patient ID: Alexis Parks, female    DOB: 1961-02-07  Age: 59 y.o. MRN: [id number]  CC: No chief complaint on file.   HPI ALTOVISE WAHLER presents for lab review. Her lipid panel showed total cholesterol of 216 mg/dL and LDL 106 mg/dL. Her hemoglobin was 17.0 g/dL, BUN was 8, K+ was 5.6 mmol/L, and alk phos was 147 IU/L. Her AST was also found to be 51 IU/L. She reports that she is not drinking much water and only really drinks [facility]. She was found to have a nitrite positive UTI from her urine sample and started antibiotics yesterday. She denies any abdominal pain or adverse effects. She reports that she was unable to afford the Allegra prescribed at last visit and would like a more affordable option for her allergic rhinitis symptoms.She states that she has been in contact with GI (colonoscopy), Pulmonology (low dose lung CT scan), Ophthalmology (eye exam), Oncology (mammogram) and Surgery (shin lump) about her referrals from last visit and is working to fill out the appropriate forms and schedule them. Overall, she states that she is doing well and offers not further complaint.   Past Medical History:  Diagnosis Date  . Allergy    seasonal  . Asthma     Past Surgical History:  Procedure Laterality Date  . AUGMENTATION MAMMAPLASTY Bilateral 33+ yrs ago   silicone  . EYE SURGERY     cataract surgery    Family History  Problem Relation Age of Onset  . Bone cancer Father   . Diabetes Maternal Grandfather     Social History   Socioeconomic History  . Marital status: Single    Spouse name: Not on file  . Number of children: Not on file  . Years of education: Not on file  . Highest education level: Not on file  Occupational History  . Not on file  Tobacco Use  . Smoking status: Current Every Day Smoker    Packs/day: 1.00    Years: 20.00    Pack years: 20.00    Types: Cigarettes  . Smokeless tobacco: Never Used  .  Tobacco comment: "I am trying to quit"  Substance and Sexual Activity  . Alcohol use: Yes    Alcohol/week: 2.0 - 3.0 standard drinks    Types: 2 - 3 Cans of beer per week    Comment: daily  . Drug use: No  . Sexual activity: Yes    Birth control/protection: Post-menopausal  Other Topics Concern  . Not on file  Social History Narrative  . Not on file   Social Determinants of Health   Financial Resource Strain:   . Difficulty of Paying Living Expenses: Not on file  Food Insecurity:   . Worried About Charity fundraiser in the Last Year: Not on file  . Ran Out of Food in the Last Year: Not on file  Transportation Needs:   . Lack of Transportation (Medical): Not on file  . Lack of Transportation (Non-Medical): Not on file  Physical Activity:   . Days of Exercise per Week: Not on file  . Minutes of Exercise per Session: Not on file  Stress:   . Feeling of Stress : Not on file  Social Connections:   . Frequency of Communication with Friends and Family: Not on file  . Frequency of Social Gatherings with Friends and Family: Not on file  . Attends Religious Services: Not on file  . Active  Member of Clubs or Organizations: Not on file  . Attends Archivist Meetings: Not on file  . Marital Status: Not on file  Intimate Partner Violence:   . Fear of Current or Ex-Partner: Not on file  . Emotionally Abused: Not on file  . Physically Abused: Not on file  . Sexually Abused: Not on file    Outpatient Medications Prior to Visit  Medication Sig Dispense Refill  . acetaminophen (TYLENOL) 325 MG tablet Take 650 mg by mouth as needed for mild pain.    Marland Kitchen albuterol (VENTOLIN HFA) 108 (90 Base) MCG/ACT inhaler Inhale 2 puffs into the lungs every 6 (six) hours as needed for wheezing or shortness of breath. 18 g 3  . Aspirin Effervescent (ALKA-SELTZER EXTRA STRENGTH PO) Take by mouth as needed.    Marland Kitchen dextromethorphan-guaiFENesin (MUCINEX DM) 30-600 MG 12hr tablet Take 1 tablet by mouth  2 (two) times daily as needed for cough.    . Fluticasone-Salmeterol (ADVAIR) 250-50 MCG/DOSE AEPB Inhale 1 puff into the lungs daily. 60 each 3  . nitrofurantoin, macrocrystal-monohydrate, (MACROBID) 100 MG capsule Take 1 capsule (100 mg total) by mouth 2 (two) times daily. 14 capsule 0  . fexofenadine (ALLEGRA ODT) 30 MG disintegrating tablet Take 1 tablet (30 mg total) by mouth daily. 30 tablet 3   No facility-administered medications prior to visit.    No Known Allergies  ROS Review of Systems  Constitutional: Negative.   HENT: Positive for congestion, postnasal drip and rhinorrhea.   Respiratory: Negative.   Cardiovascular: Negative.   Gastrointestinal: Negative.   Genitourinary: Positive for dysuria.       Improving since starting antibiotics  Musculoskeletal: Negative.   Skin: Negative.   Allergic/Immunologic: Positive for environmental allergies.  Neurological: Negative.   Hematological: Negative.   Psychiatric/Behavioral: Negative.       Objective:    Physical Exam HENT:     Nose: Congestion and rhinorrhea present.     Mouth/Throat:     Mouth: Mucous membranes are moist.     Pharynx: Oropharynx is clear.  Eyes:     Conjunctiva/sclera: Conjunctivae normal.  Cardiovascular:     Rate and Rhythm: Normal rate and regular rhythm.     Heart sounds: Normal heart sounds.  Pulmonary:     Effort: Pulmonary effort is normal.     Breath sounds: Normal breath sounds.  Abdominal:     General: Abdomen is flat. Bowel sounds are normal.     Palpations: Abdomen is soft.  Musculoskeletal:        General: Normal range of motion.  Skin:    General: Skin is warm and dry.  Neurological:     General: No focal deficit present.     Mental Status: She is alert and oriented to person, place, and time.  Psychiatric:        Mood and Affect: Mood normal.        Behavior: Behavior normal.        Thought Content: Thought content normal.        Judgment: Judgment normal.     BP  128/83 (BP Location: Left Arm, Patient Position: Sitting, Cuff Size: Normal)   Pulse 96   Resp 18   Wt 114 lb 1.6 oz (51.8 kg)   SpO2 95%   BMI 19.59 kg/m  Wt Readings from Last 3 Encounters:  07/09/20 114 lb 1.6 oz (51.8 kg)  06/25/20 113 lb 6.4 oz (51.4 kg)  05/16/20 115 lb (52.2 kg)  Health Maintenance Due  Topic Date Due  . Hepatitis C Screening  Never done  . COVID-19 Vaccine (1) Never done  . HIV Screening  Never done  . TETANUS/TDAP  Never done  . COLONOSCOPY  Never done  . PAP SMEAR-Modifier  10/14/2019  . MAMMOGRAM  12/15/2019    There are no preventive care reminders to display for this patient.  Lab Results  Component Value Date   TSH 1.120 06/25/2020   Lab Results  Component Value Date   WBC 8.4 06/25/2020   HGB 17.0 (H) 06/25/2020   HCT 49.0 (H) 06/25/2020   MCV 101 (H) 06/25/2020   PLT 320 06/25/2020   Lab Results  Component Value Date   NA 144 06/25/2020   K 5.6 (H) 06/25/2020   CO2 23 06/25/2020   GLUCOSE 94 06/25/2020   BUN 6 06/25/2020   CREATININE 0.79 06/25/2020   BILITOT 0.5 06/25/2020   ALKPHOS 147 (H) 06/25/2020   AST 51 (H) 06/25/2020   ALT 30 06/25/2020   PROT 7.6 06/25/2020   ALBUMIN 4.4 06/25/2020   CALCIUM 10.6 (H) 06/25/2020   ANIONGAP 7 06/28/2016   Lab Results  Component Value Date   CHOL 216 (H) 06/25/2020   Lab Results  Component Value Date   HDL 94 06/25/2020   Lab Results  Component Value Date   LDLCALC 106 (H) 06/25/2020   Lab Results  Component Value Date   TRIG 95 06/25/2020   Lab Results  Component Value Date   CHOLHDL 2.3 06/25/2020   Lab Results  Component Value Date   HGBA1C 5.0 06/25/2020      Assessment & Plan:   1. Acute cystitis without hematuria Finish antibiotic regimen completely. Increase water intake. - UA/M w/rflx Culture, Routine; Future  2. Allergy, subsequent encounter Take medications as prescribed. Avoid allergen triggers. - fluticasone (FLONASE) 50 MCG/ACT nasal  spray; Place 1 spray into both nostrils daily.  Dispense: 16 g; Refill: 0 - cetirizine (ZYRTEC) 5 MG tablet; Take 1 tablet (5 mg total) by mouth daily.  Dispense: 30 tablet; Refill: 0  3. Healthcare Maintenence Flu vaccine provided in clinic today. Start vitamin D 50000 units capsule weekly for 16 weeks. - Flu Vaccine QUAD 6+ mos PF IM (Fluarix Quad PF) - Vitamin D, Ergocalciferol, (DRISDOL) 1.25 MG (50000 UNIT) CAPS capsule; Take 1 capsule (50,000 Units total) by mouth every 7 (seven) days for 16 doses.  Dispense: 16 capsule  4. Elevated lipids ASCVD risk estimated at 3.8%. Statin initiation not indicated. Counseled to stop smoking Educated on low fat/ low cholesterol diet.   Follow-up: Return in about 4 weeks (around 08/06/2020), or if symptoms worsen or fail to improve.    Marina Gravel, Student-NP

## 2020-07-09 NOTE — Procedures (Signed)
Flu vaccine administered in left deltoid muscle with Dr. Buren Kos and Agustina Caroli, nursing student. Vaccine consent obtained.

## 2020-07-14 ENCOUNTER — Telehealth: Payer: Self-pay

## 2020-07-14 NOTE — Telephone Encounter (Signed)
Contacted patient for lung CT screening program based on referral from [facility]. Message left with patient to call Glenna Fellows, lung navigator to schedule CT scan.

## 2020-07-15 ENCOUNTER — Telehealth: Payer: Self-pay | Admitting: Pharmacist

## 2020-07-15 NOTE — Telephone Encounter (Signed)
07/15/2020 9:22:58 AM - [employer] application faxed for ProAir HFA  -- Rhetta Mura - Tuesday, July 15, 2020 9:21 AM --Donetta Potts application for [employer] HFA 0.09mg  Inhale 2 puffs by mouth every 6 hours as needed for wheezing or shortness of breath. # 3  (replaces Ventolin)  07/15/2020 9:21:35 AM - [employer] application faxed for Advair 250/50  -- Rhetta Mura - Tuesday, July 15, 2020 9:20 AM --Faxed [employer] application for Advair 250/50 Inhale 1 puff by mouth 2 times a day #3.

## 2020-07-17 ENCOUNTER — Ambulatory Visit: Payer: Self-pay | Admitting: General Surgery

## 2020-07-22 ENCOUNTER — Telehealth: Payer: Self-pay

## 2020-07-22 DIAGNOSIS — Z122 Encounter for screening for malignant neoplasm of respiratory organs: Secondary | ICD-10-CM

## 2020-07-22 DIAGNOSIS — Z87891 Personal history of nicotine dependence: Secondary | ICD-10-CM

## 2020-07-22 NOTE — Telephone Encounter (Signed)
Contacted patient for lung CT screening program based on referral from [facility]. Patient explained she has applied for charity care with [facility] and was waiting until that was approved to call us.  I explained we have a foundation set up for our program as we are a cancer screening program and have instructed her to call Glenna Fellows when she gets a bill for the scan (the billing process will not stop).  She is agreeable to participate in the program.  We have scheduled her CT for Tues Nov 30 at 2:00.  She is a current smoker, smoking 1 pack a day. She started smoking at age 12.

## 2020-07-23 NOTE — Telephone Encounter (Signed)
Current smoker, 42 pack year 

## 2020-07-23 NOTE — Addendum Note (Signed)
Addended by: Jonne Ply on: 07/23/2020 11:33 AM   Modules accepted: Orders

## 2020-07-25 ENCOUNTER — Encounter: Payer: Self-pay | Admitting: Nurse Practitioner

## 2020-08-06 ENCOUNTER — Other Ambulatory Visit: Payer: Self-pay

## 2020-08-06 DIAGNOSIS — N3000 Acute cystitis without hematuria: Secondary | ICD-10-CM

## 2020-08-11 LAB — MICROSCOPIC EXAMINATION: Casts: NONE SEEN /lpf

## 2020-08-11 LAB — UA/M W/RFLX CULTURE, ROUTINE
Bilirubin, UA: NEGATIVE
Glucose, UA: NEGATIVE
Nitrite, UA: POSITIVE — AB
Protein,UA: NEGATIVE
RBC, UA: NEGATIVE
Specific Gravity, UA: 1.019 (ref 1.005–1.030)
Urobilinogen, Ur: 1 mg/dL (ref 0.2–1.0)
pH, UA: 7.5 (ref 5.0–7.5)

## 2020-08-11 LAB — URINE CULTURE, REFLEX

## 2020-08-12 ENCOUNTER — Other Ambulatory Visit: Payer: Self-pay | Admitting: Gerontology

## 2020-08-12 DIAGNOSIS — N390 Urinary tract infection, site not specified: Secondary | ICD-10-CM

## 2020-08-12 MED ORDER — SULFAMETHOXAZOLE-TRIMETHOPRIM 800-160 MG PO TABS: 1.0000 | ORAL_TABLET | Freq: Two times a day (BID) | ORAL | 0 refills | Status: DC

## 2020-08-13 ENCOUNTER — Other Ambulatory Visit: Payer: Self-pay

## 2020-08-13 ENCOUNTER — Ambulatory Visit: Payer: Self-pay | Admitting: Gerontology

## 2020-08-13 VITALS — BP 154/99 | HR 102 | Resp 16 | Wt 117.6 lb

## 2020-08-13 DIAGNOSIS — R399 Unspecified symptoms and signs involving the genitourinary system: Secondary | ICD-10-CM

## 2020-08-13 DIAGNOSIS — R748 Abnormal levels of other serum enzymes: Secondary | ICD-10-CM | POA: Insufficient documentation

## 2020-08-13 DIAGNOSIS — R030 Elevated blood-pressure reading, without diagnosis of hypertension: Secondary | ICD-10-CM | POA: Insufficient documentation

## 2020-08-13 DIAGNOSIS — E875 Hyperkalemia: Secondary | ICD-10-CM

## 2020-08-13 DIAGNOSIS — E785 Hyperlipidemia, unspecified: Secondary | ICD-10-CM

## 2020-08-13 DIAGNOSIS — I10 Essential (primary) hypertension: Secondary | ICD-10-CM

## 2020-08-13 DIAGNOSIS — D751 Secondary polycythemia: Secondary | ICD-10-CM

## 2020-08-13 DIAGNOSIS — F17200 Nicotine dependence, unspecified, uncomplicated: Secondary | ICD-10-CM

## 2020-08-13 MED ORDER — AMLODIPINE BESYLATE 5 MG PO TABS: 5.0000 mg | ORAL_TABLET | Freq: Every day | ORAL | 0 refills | Status: DC

## 2020-08-13 MED ORDER — BLOOD PRESSURE KIT DEVI: 1.0000 | Freq: Every day | 0 refills | Status: DC

## 2020-08-13 NOTE — Patient Instructions (Signed)
Smoking Tobacco Information, Adult °Smoking tobacco can be harmful to your health. Tobacco contains a poisonous (toxic), colorless chemical called nicotine. Nicotine is addictive. It changes the brain and can make it hard to stop smoking. Tobacco also has other toxic chemicals that can hurt your body and raise your risk of many cancers. °How can smoking tobacco affect me? °Smoking tobacco puts you at risk for: °· Cancer. Smoking is most commonly associated with lung cancer, but can also lead to cancer in other parts of the body. °· Chronic obstructive pulmonary disease (COPD). This is a long-term lung condition that makes it hard to breathe. It also gets worse over time. °· High blood pressure (hypertension), heart disease, stroke, or heart attack. °· Lung infections, such as pneumonia. °· Cataracts. This is when the lenses in the eyes become clouded. °· Digestive problems. This may include peptic ulcers, heartburn, and gastroesophageal reflux disease (GERD). °· Oral health problems, such as gum disease and tooth loss. °· Loss of taste and smell. °Smoking can affect your appearance by causing: °· Wrinkles. °· Yellow or stained teeth, fingers, and fingernails. °Smoking tobacco can also affect your social life, because: °· It may be challenging to find places to smoke when away from home. Many workplaces, restaurants, hotels, and public places are tobacco-free. °· Smoking is expensive. This is due to the cost of tobacco and the long-term costs of treating health problems from smoking. °· Secondhand smoke may affect those around you. Secondhand smoke can cause lung cancer, breathing problems, and heart disease. Children of smokers have a higher risk for: °? Sudden infant death syndrome (SIDS). °? Ear infections. °? Lung infections. °If you currently smoke tobacco, quitting now can help you: °· Lead a longer and healthier life. °· Look, smell, breathe, and feel better over time. °· Save money. °· Protect others from the  harms of secondhand smoke. °What actions can I take to prevent health problems? °Quit smoking ° °· Do not start smoking. Quit if you already do. °· Make a plan to quit smoking and commit to it. Look for programs to help you and ask your health care provider for recommendations and ideas. °· Set a date and write down all the reasons you want to quit. °· Let your friends and family know you are quitting so they can help and support you. Consider finding friends who also want to quit. It can be easier to quit with someone else, so that you can support each other. °· Talk with your health care provider about using nicotine replacement medicines to help you quit, such as gum, lozenges, patches, sprays, or pills. °· Do not replace cigarette smoking with electronic cigarettes, which are commonly called e-cigarettes. The safety of e-cigarettes is not known, and some may contain harmful chemicals. °· If you try to quit but return to smoking, stay positive. It is common to slip up when you first quit, so take it one day at a time. °· Be prepared for cravings. When you feel the urge to smoke, chew gum or suck on hard candy. °Lifestyle °· Stay busy and take care of your body. °· Drink enough fluid to keep your urine pale yellow. °· Get plenty of exercise and eat a healthy diet. This can help prevent weight gain after quitting. °· Monitor your eating habits. Quitting smoking can cause you to have a larger appetite than when you smoke. °· Find ways to relax. Go out with friends or family to a movie or a restaurant   where people do not smoke. °· Ask your health care provider about having regular tests (screenings) to check for cancer. This may include blood tests, imaging tests, and other tests. °· Find ways to manage your stress, such as meditation, yoga, or exercise. °Where to find support °To get support to quit smoking, consider: °· Asking your health care provider for more information and resources. °· Taking classes to learn  more about quitting smoking. °· Looking for local organizations that offer resources about quitting smoking. °· Joining a support group for people who want to quit smoking in your local community. °· Calling the smokefree.gov counselor helpline: 1-800-Quit-Now (1-800-784-8669) °Where to find more information °You may find more information about quitting smoking from: °· HelpGuide.org: www.helpguide.org °· Smokefree.gov: smokefree.gov °· American Lung Association: www.lung.org °Contact a health care provider if you: °· Have problems breathing. °· Notice that your lips, nose, or fingers turn blue. °· Have chest pain. °· Are coughing up blood. °· Feel faint or you pass out. °· Have other health changes that cause you to worry. °Summary °· Smoking tobacco can negatively affect your health, the health of those around you, your finances, and your social life. °· Do not start smoking. Quit if you already do. If you need help quitting, ask your health care provider. °· Think about joining a support group for people who want to quit smoking in your local community. There are many effective programs that will help you to quit this behavior. °This information is not intended to replace advice given to you by your health care provider. Make sure you discuss any questions you have with your health care provider. °Document Revised: 06/01/2019 Document Reviewed: 09/21/2016 °Elsevier Patient Education © 2020 [employer] °DASH Eating Plan °DASH stands for "Dietary Approaches to Stop Hypertension." The DASH eating plan is a healthy eating plan that has been shown to reduce high blood pressure (hypertension). It may also reduce your risk for type 2 diabetes, heart disease, and stroke. The DASH eating plan may also help with weight loss. °What are tips for following this plan? ° °General guidelines °· Avoid eating more than 2,300 mg (milligrams) of salt (sodium) a day. If you have hypertension, you may need to reduce your sodium  intake to 1,500 mg a day. °· Limit alcohol intake to no more than 1 drink a day for nonpregnant women and 2 drinks a day for men. One drink equals 12 oz of beer, 5 oz of wine, or 1½ oz of hard liquor. °· Work with your health care provider to maintain a healthy body weight or to lose weight. Ask what an ideal weight is for you. °· Get at least 30 minutes of exercise that causes your heart to beat faster (aerobic exercise) most days of the week. Activities may include walking, swimming, or biking. °· Work with your health care provider or diet and nutrition specialist (dietitian) to adjust your eating plan to your individual calorie needs. °Reading food labels ° °· Check food labels for the amount of sodium per serving. Choose foods with less than 5 percent of the Daily Value of sodium. Generally, foods with less than 300 mg of sodium per serving fit into this eating plan. °· To find whole grains, look for the word "whole" as the first word in the ingredient list. °Shopping °· Buy products labeled as "low-sodium" or "no salt added." °· Buy fresh foods. Avoid canned foods and premade or frozen meals. °Cooking °· Avoid adding salt when cooking. Use   salt-free seasonings or herbs instead of table salt or sea salt. Check with your health care provider or pharmacist before using salt substitutes. °· Do not fry foods. Cook foods using healthy methods such as baking, boiling, grilling, and broiling instead. °· Cook with heart-healthy oils, such as olive, canola, soybean, or sunflower oil. °Meal planning °· Eat a balanced diet that includes: °? 5 or more servings of fruits and vegetables each day. At each meal, try to fill half of your plate with fruits and vegetables. °? Up to 6-8 servings of whole grains each day. °? Less than 6 oz of lean meat, poultry, or fish each day. A 3-oz serving of meat is about the same size as a deck of cards. One egg equals 1 oz. °? 2 servings of low-fat dairy each day. °? A serving of nuts,  seeds, or beans 5 times each week. °? Heart-healthy fats. Healthy fats called Omega-3 fatty acids are found in foods such as flaxseeds and coldwater fish, like sardines, salmon, and mackerel. °· Limit how much you eat of the following: °? Canned or prepackaged foods. °? Food that is high in trans fat, such as fried foods. °? Food that is high in saturated fat, such as fatty meat. °? Sweets, desserts, sugary drinks, and other foods with added sugar. °? Full-fat dairy products. °· Do not salt foods before eating. °· Try to eat at least 2 vegetarian meals each week. °· Eat more home-cooked food and less restaurant, buffet, and fast food. °· When eating at a restaurant, ask that your food be prepared with less salt or no salt, if possible. °What foods are recommended? °The items listed may not be a complete list. Talk with your dietitian about what dietary choices are best for you. °Grains °Whole-grain or whole-wheat bread. Whole-grain or whole-wheat pasta. Brown rice. Oatmeal. Quinoa. Bulgur. Whole-grain and low-sodium cereals. Pita bread. Low-fat, low-sodium crackers. Whole-wheat flour tortillas. °Vegetables °Fresh or frozen vegetables (raw, steamed, roasted, or grilled). Low-sodium or reduced-sodium tomato and vegetable juice. Low-sodium or reduced-sodium tomato sauce and tomato paste. Low-sodium or reduced-sodium canned vegetables. °Fruits °All fresh, dried, or frozen fruit. Canned fruit in natural juice (without added sugar). °Meat and other protein foods °Skinless chicken or turkey. Ground chicken or turkey. Pork with fat trimmed off. Fish and seafood. Egg whites. Dried beans, peas, or lentils. Unsalted nuts, nut butters, and seeds. Unsalted canned beans. Lean cuts of beef with fat trimmed off. Low-sodium, lean deli meat. °Dairy °Low-fat (1%) or fat-free (skim) milk. Fat-free, low-fat, or reduced-fat cheeses. Nonfat, low-sodium ricotta or cottage cheese. Low-fat or nonfat yogurt. Low-fat, low-sodium cheese. °Fats  and oils °Soft margarine without trans fats. Vegetable oil. Low-fat, reduced-fat, or light mayonnaise and salad dressings (reduced-sodium). Canola, safflower, olive, soybean, and sunflower oils. Avocado. °Seasoning and other foods °Herbs. Spices. Seasoning mixes without salt. Unsalted popcorn and pretzels. Fat-free sweets. °What foods are not recommended? °The items listed may not be a complete list. Talk with your dietitian about what dietary choices are best for you. °Grains °Baked goods made with fat, such as croissants, muffins, or some breads. Dry pasta or rice meal packs. °Vegetables °Creamed or fried vegetables. Vegetables in a cheese sauce. Regular canned vegetables (not low-sodium or reduced-sodium). Regular canned tomato sauce and paste (not low-sodium or reduced-sodium). Regular tomato and vegetable juice (not low-sodium or reduced-sodium). Pickles. Olives. °Fruits °Canned fruit in a light or heavy syrup. Fried fruit. Fruit in cream or butter sauce. °Meat and other protein foods °Fatty cuts   of meat. Ribs. Fried meat. Bacon. Sausage. Bologna and other processed lunch meats. Salami. Fatback. Hotdogs. Bratwurst. Salted nuts and seeds. Canned beans with added salt. Canned or smoked fish. Whole eggs or egg yolks. Chicken or turkey with skin. °Dairy °Whole or 2% milk, cream, and half-and-half. Whole or full-fat cream cheese. Whole-fat or sweetened yogurt. Full-fat cheese. Nondairy creamers. Whipped toppings. Processed cheese and cheese spreads. °Fats and oils °Butter. Stick margarine. Lard. Shortening. Ghee. Bacon fat. Tropical oils, such as coconut, palm kernel, or palm oil. °Seasoning and other foods °Salted popcorn and pretzels. Onion salt, garlic salt, seasoned salt, table salt, and sea salt. Worcestershire sauce. Tartar sauce. Barbecue sauce. Teriyaki sauce. Soy sauce, including reduced-sodium. Steak sauce. Canned and packaged gravies. Fish sauce. Oyster sauce. Cocktail sauce. Horseradish that you find on  the shelf. Ketchup. Mustard. Meat flavorings and tenderizers. Bouillon cubes. Hot sauce and Tabasco sauce. Premade or packaged marinades. Premade or packaged taco seasonings. Relishes. Regular salad dressings. °Where to find more information: °· National Heart, Lung, and Blood Institute: www.nhlbi.nih.gov °· American Heart Association: www.heart.org °Summary °· The DASH eating plan is a healthy eating plan that has been shown to reduce high blood pressure (hypertension). It may also reduce your risk for type 2 diabetes, heart disease, and stroke. °· With the DASH eating plan, you should limit salt (sodium) intake to 2,300 mg a day. If you have hypertension, you may need to reduce your sodium intake to 1,500 mg a day. °· When on the DASH eating plan, aim to eat more fresh fruits and vegetables, whole grains, lean proteins, low-fat dairy, and heart-healthy fats. °· Work with your health care provider or diet and nutrition specialist (dietitian) to adjust your eating plan to your individual calorie needs. °This information is not intended to replace advice given to you by your health care provider. Make sure you discuss any questions you have with your health care provider. °Document Revised: 08/19/2017 Document Reviewed: 08/30/2016 °Elsevier Patient Education © 2020 Elsevier Inc. ° °

## 2020-08-13 NOTE — Progress Notes (Signed)
Established Patient Office Visit  Subjective:  Patient ID: Alexis Parks, female    DOB: 1961-06-16  Age: 59 y.o. MRN: [id number]  CC: No chief complaint on file.   HPI Alexis Parks presents for routine follow up and lab review. Her blood pressure was elevated during visit, she denies chest pain, palpitation, dizziness and she continues to smoke 1 pack of cigarette daily and admits the desire to quit. Her Urine culture was positive for urinary tract infection and she is yet to pick up Bactrim from the Pharmacy. She continues to c/o dysuria, urinary frequency, urgency, but denies fever, chills, flank and pelvic pain. Her CBC done on 06/25/2020, Hemoglobin was 17 g/dl, Hematocrit 49%, MCV 153f, and MCH 35.2 pg. Her Potasium was 5.6 when done on 06/25/2020 and AST 51. She states that she drinks 1 can of beer daily and 4-5 16 ounce cans on the weekend. Her total cholesterol was 216 mg/dl and LDL was 106 mg/dl. Overall, she states that she's doing well and offers no further complaint.  Past Medical History:  Diagnosis Date  . Allergy    seasonal  . Asthma     Past Surgical History:  Procedure Laterality Date  . AUGMENTATION MAMMAPLASTY Bilateral 239+yrs ago   silicone  . EYE SURGERY     cataract surgery    Family History  Problem Relation Age of Onset  . Bone cancer Father   . Diabetes Maternal Grandfather     Social History   Socioeconomic History  . Marital status: Single    Spouse name: Not on file  . Number of children: Not on file  . Years of education: Not on file  . Highest education level: Not on file  Occupational History  . Not on file  Tobacco Use  . Smoking status: Current Every Day Smoker    Packs/day: 1.00    Years: 20.00    Pack years: 20.00    Types: Cigarettes  . Smokeless tobacco: Never Used  . Tobacco comment: "I am trying to quit"  Substance and Sexual Activity  . Alcohol use: Yes    Alcohol/week: 2.0 - 3.0 standard drinks    Types: 2 - 3  Cans of beer per week    Comment: daily  . Drug use: No  . Sexual activity: Yes    Birth control/protection: Post-menopausal  Other Topics Concern  . Not on file  Social History Narrative  . Not on file   Social Determinants of Health   Financial Resource Strain:   . Difficulty of Paying Living Expenses: Not on file  Food Insecurity:   . Worried About RCharity fundraiserin the Last Year: Not on file  . Ran Out of Food in the Last Year: Not on file  Transportation Needs:   . Lack of Transportation (Medical): Not on file  . Lack of Transportation (Non-Medical): Not on file  Physical Activity:   . Days of Exercise per Week: Not on file  . Minutes of Exercise per Session: Not on file  Stress:   . Feeling of Stress : Not on file  Social Connections:   . Frequency of Communication with Friends and Family: Not on file  . Frequency of Social Gatherings with Friends and Family: Not on file  . Attends Religious Services: Not on file  . Active Member of Clubs or Organizations: Not on file  . Attends CArchivistMeetings: Not on file  . Marital Status: Not on  file  Intimate Partner Violence:   . Fear of Current or Ex-Partner: Not on file  . Emotionally Abused: Not on file  . Physically Abused: Not on file  . Sexually Abused: Not on file    Outpatient Medications Prior to Visit  Medication Sig Dispense Refill  . acetaminophen (TYLENOL) 325 MG tablet Take 650 mg by mouth as needed for mild pain.    Marland Kitchen albuterol (VENTOLIN HFA) 108 (90 Base) MCG/ACT inhaler Inhale 2 puffs into the lungs every 6 (six) hours as needed for wheezing or shortness of breath. 18 g 3  . Aspirin Effervescent (ALKA-SELTZER EXTRA STRENGTH PO) Take by mouth as needed.    . cetirizine (ZYRTEC) 5 MG tablet Take 1 tablet (5 mg total) by mouth daily. 30 tablet 0  . fluticasone (FLONASE) 50 MCG/ACT nasal spray Place 1 spray into both nostrils daily. 16 g 0  . Fluticasone-Salmeterol (ADVAIR) 250-50 MCG/DOSE AEPB  Inhale 1 puff into the lungs daily. 60 each 3  . sulfamethoxazole-trimethoprim (BACTRIM DS) 800-160 MG tablet Take 1 tablet by mouth 2 (two) times daily. 14 tablet 0  . Vitamin D, Ergocalciferol, (DRISDOL) 1.25 MG (50000 UNIT) CAPS capsule Take 1 capsule (50,000 Units total) by mouth every 7 (seven) days. 16 capsule 0  . dextromethorphan-guaiFENesin (MUCINEX DM) 30-600 MG 12hr tablet Take 1 tablet by mouth 2 (two) times daily as needed for cough.     No facility-administered medications prior to visit.    No Known Allergies  ROS Review of Systems  Constitutional: Negative.   Respiratory: Negative.   Cardiovascular: Negative.   Genitourinary: Positive for dysuria, frequency and urgency.  Skin: Negative.   Neurological: Negative.       Objective:    Physical Exam HENT:     Head: Normocephalic.  Eyes:     Extraocular Movements: Extraocular movements intact.     Conjunctiva/sclera: Conjunctivae normal.     Pupils: Pupils are equal, round, and reactive to light.  Cardiovascular:     Rate and Rhythm: Normal rate and regular rhythm.     Pulses: Normal pulses.     Heart sounds: Normal heart sounds.  Pulmonary:     Effort: Pulmonary effort is normal.     Breath sounds: Normal breath sounds.  Abdominal:     General: Bowel sounds are normal.     Palpations: Abdomen is soft.     Tenderness: There is no right CVA tenderness or left CVA tenderness.  Skin:    General: Skin is warm.  Neurological:     General: No focal deficit present.     Mental Status: She is alert and oriented to person, place, and time. Mental status is at baseline.  Psychiatric:        Mood and Affect: Mood normal.        Behavior: Behavior normal.        Thought Content: Thought content normal.        Judgment: Judgment normal.     BP (!) 154/99 (BP Location: Left Arm, Patient Position: Sitting, Cuff Size: Normal)   Pulse (!) 102   Resp 16   Wt 117 lb 9.6 oz (53.3 kg)   SpO2 97%   BMI 20.19 kg/m  Wt  Readings from Last 3 Encounters:  08/13/20 117 lb 9.6 oz (53.3 kg)  07/09/20 114 lb 1.6 oz (51.8 kg)  06/25/20 113 lb 6.4 oz (51.4 kg)     Health Maintenance Due  Topic Date Due  . Hepatitis C Screening  Never done  . COVID-19 Vaccine (1) Never done  . HIV Screening  Never done  . TETANUS/TDAP  Never done  . COLONOSCOPY  Never done  . PAP SMEAR-Modifier  10/14/2019  . MAMMOGRAM  12/15/2019    There are no preventive care reminders to display for this patient.  Lab Results  Component Value Date   TSH 1.120 06/25/2020   Lab Results  Component Value Date   WBC 8.4 06/25/2020   HGB 17.0 (H) 06/25/2020   HCT 49.0 (H) 06/25/2020   MCV 101 (H) 06/25/2020   PLT 320 06/25/2020   Lab Results  Component Value Date   NA 144 08/13/2020   K 4.8 08/13/2020   CO2 22 08/13/2020   GLUCOSE 107 (H) 08/13/2020   BUN 9 08/13/2020   CREATININE 0.88 08/13/2020   BILITOT 0.5 06/25/2020   ALKPHOS 147 (H) 06/25/2020   AST 51 (H) 06/25/2020   ALT 30 06/25/2020   PROT 7.6 06/25/2020   ALBUMIN 4.4 06/25/2020   CALCIUM 10.1 08/13/2020   ANIONGAP 7 06/28/2016   Lab Results  Component Value Date   CHOL 216 (H) 06/25/2020   Lab Results  Component Value Date   HDL 94 06/25/2020   Lab Results  Component Value Date   LDLCALC 106 (H) 06/25/2020   Lab Results  Component Value Date   TRIG 95 06/25/2020   Lab Results  Component Value Date   CHOLHDL 2.3 06/25/2020   Lab Results  Component Value Date   HGBA1C 5.0 06/25/2020      Assessment & Plan:     1. Hyperkalemia -Her potasium was rechecked and it's 4.8 mmol/L. - Basic Metabolic Panel (BMET); Future - Basic Metabolic Panel (BMET)  2. Symptoms of urinary tract infection - She was advised to start Bactrim and educated on medication side effects and advised to notify clinic. She was encouraged to increase water intake and proper perineal hygiene.  3. Elevated lipids -The 10-year ASCVD risk score Mikey Bussing DC Jr., et al.,  2013) is: 7.4%   Values used to calculate the score:     Age: 66 years     Sex: Female     Is Non-Hispanic African American: No     Diabetic: No     Tobacco smoker: Yes     Systolic Blood Pressure: 944 mmHg     Is BP treated: Yes     HDL Cholesterol: 94 mg/dL     Total Cholesterol: 216 mg/dL Her ASCVD was 7.4%, was advised to continue on low fat/ cholesterol diet, abstain from Alcohol consumption, and smoking cessation. 4. Elevated liver enzymes  Her AST was 51, she was strongly encouraged to abstain from Alcohol consumption.  5. Essential hypertension - Her blood pressure was not under control, her goal should be less than 140/90. She was advised to take Norvasc 5 mg daily, educated on medication side effects and to notify clinic. She's to continue on DASH diet and smoking cessation. She was provided with blood pressure machine, advised to check it daily, record and bring log to follow up appointment. - amLODipine (NORVASC) 5 MG tablet; Take 1 tablet (5 mg total) by mouth daily.  Dispense: 30 tablet; Refill: 0 - Blood Pressure Monitoring (BLOOD PRESSURE KIT) DEVI; 1 kit by Does not apply route daily.  Dispense: 1 each; Refill: 0  6. Smoking - She was provided with [place] Quitline information and advised on smoking cessation.  7. Polycythemia - Likely due to dehydration or smoking. She was  advised to increase water intake, smoking cessation and will recheck in 3 months.   Follow-up: Return in about 15 days (around 08/28/2020), or if symptoms worsen or fail to improve.    Adebayo Ensminger Jerold Coombe, NP

## 2020-08-14 LAB — BASIC METABOLIC PANEL
BUN/Creatinine Ratio: 10 (ref 9–23)
BUN: 9 mg/dL (ref 6–24)
CO2: 22 mmol/L (ref 20–29)
Calcium: 10.1 mg/dL (ref 8.7–10.2)
Chloride: 104 mmol/L (ref 96–106)
Creatinine, Ser: 0.88 mg/dL (ref 0.57–1.00)
GFR calc Af Amer: 84 mL/min/{1.73_m2} (ref >59)
GFR calc non Af Amer: 73 mL/min/{1.73_m2} (ref >59)
Glucose: 107 mg/dL — ABNORMAL HIGH (ref 65–99)
Potassium: 4.8 mmol/L (ref 3.5–5.2)
Sodium: 144 mmol/L (ref 134–144)

## 2020-08-19 ENCOUNTER — Ambulatory Visit
Admission: RE | Admit: 2020-08-19 | Discharge: 2020-08-19 | Disposition: A | Discharge status: Home / Self Care | Payer: Self-pay | Source: Ambulatory Visit | Attending: Nurse Practitioner | Admitting: Nurse Practitioner

## 2020-08-19 ENCOUNTER — Other Ambulatory Visit: Payer: Self-pay

## 2020-08-19 ENCOUNTER — Encounter: Payer: Self-pay | Admitting: Nurse Practitioner

## 2020-08-19 ENCOUNTER — Inpatient Hospital Stay: Payer: Self-pay | Attending: Nurse Practitioner | Admitting: Nurse Practitioner

## 2020-08-19 DIAGNOSIS — Z87891 Personal history of nicotine dependence: Secondary | ICD-10-CM | POA: Insufficient documentation

## 2020-08-19 DIAGNOSIS — Z122 Encounter for screening for malignant neoplasm of respiratory organs: Secondary | ICD-10-CM | POA: Insufficient documentation

## 2020-08-19 DIAGNOSIS — F17210 Nicotine dependence, cigarettes, uncomplicated: Secondary | ICD-10-CM

## 2020-08-19 NOTE — Progress Notes (Signed)
Virtual Visit via Video Enabled Telemedicine Note   I connected with Alexis Parks on 08/19/20 at 2:00 PM EST by video enabled telemedicine visit and verified that I am speaking with the correct person using two identifiers.   I discussed the limitations, risks, security and privacy concerns of performing an evaluation and management service by telemedicine and the availability of in-person appointments. I also discussed with the patient that there may be a patient responsible charge related to this service. The patient expressed understanding and agreed to proceed.   Other persons participating in the visit and their role in the encounter: Burgess Estelle, RN- checking in patient & navigation  Patient's location: [place]  Provider's location: Clinic  Chief Complaint: Low Dose CT Screening  Patient agreed to evaluation by telemedicine to discuss shared decision making for consideration of low dose CT lung cancer screening.    In accordance with CMS guidelines, patient has met eligibility criteria including age, absence of signs or symptoms of lung cancer.  Social History   Tobacco Use  . Smoking status: Current Every Day Smoker    Packs/day: 1.00    Years: 42.00    Pack years: 42.00    Types: Cigarettes  . Smokeless tobacco: Never Used  . Tobacco comment: "I am trying to quit"  Substance Use Topics  . Alcohol use: Yes    Alcohol/week: 2.0 - 3.0 standard drinks    Types: 2 - 3 Cans of beer per week    Comment: daily     A shared decision-making session was conducted prior to the performance of CT scan. This includes one or more decision aids, includes benefits and harms of screening, follow-up diagnostic testing, over-diagnosis, false positive rate, and total radiation exposure.   Counseling on the importance of adherence to annual lung cancer LDCT screening, impact of co-morbidities, and ability or willingness to undergo diagnosis and treatment is imperative for  compliance of the program.   Counseling on the importance of continued smoking cessation for former smokers; the importance of smoking cessation for current smokers, and information about tobacco cessation interventions have been given to patient including [place] and [facility] [place] programs.   Written order for lung cancer screening with LDCT has been given to the patient and any and all questions have been answered to the best of my abilities.    Yearly follow up will be coordinated by Burgess Estelle, Thoracic Navigator.  I discussed the assessment and treatment plan with the patient. The patient was provided an opportunity to ask questions and all were answered. The patient agreed with the plan and demonstrated an understanding of the instructions.   The patient was advised to call back or seek an in-person evaluation if the symptoms worsen or if the condition fails to improve as anticipated.   I provided 15 minutes of face-to-face video visit time during this encounter, and > 50% was spent counseling as documented under my assessment & plan.   Beckey Rutter, DNP, AGNP-C [place] [facility] [phone] (clinic)

## 2020-08-21 ENCOUNTER — Telehealth: Payer: Self-pay | Admitting: *Deleted

## 2020-08-21 NOTE — Telephone Encounter (Signed)
Notified patient of LDCT lung cancer screening program results with recommendation for 12 month follow up imaging. Also notified of incidental findings noted below and is encouraged to discuss further with PCP who will receive a copy of this note and/or the CT report. Patient verbalizes understanding.   IMPRESSION: 1. Lung-RADS 2, benign appearance or behavior. Continue annual screening with low-dose chest CT without contrast in 12 months. 2. Two-vessel coronary atherosclerosis. 3. Ectatic 4.1 cm ascending thoracic aorta, which can be reassessed on follow-up screening chest CT in 12 months. 4. Aortic Atherosclerosis (ICD10-I70.0) and Emphysema (ICD10-J43.9).

## 2020-08-26 ENCOUNTER — Telehealth: Payer: Self-pay | Admitting: Nurse Practitioner

## 2020-08-28 ENCOUNTER — Other Ambulatory Visit: Payer: Self-pay

## 2020-08-28 ENCOUNTER — Other Ambulatory Visit: Payer: Self-pay | Admitting: Gerontology

## 2020-08-28 ENCOUNTER — Ambulatory Visit: Payer: Self-pay | Admitting: Gerontology

## 2020-08-28 ENCOUNTER — Encounter: Payer: Self-pay | Admitting: Gerontology

## 2020-08-28 ENCOUNTER — Ambulatory Visit: Payer: Self-pay

## 2020-08-28 VITALS — BP 142/91 | HR 102

## 2020-08-28 DIAGNOSIS — E785 Hyperlipidemia, unspecified: Secondary | ICD-10-CM

## 2020-08-28 DIAGNOSIS — I77819 Aortic ectasia, unspecified site: Secondary | ICD-10-CM | POA: Insufficient documentation

## 2020-08-28 DIAGNOSIS — I10 Essential (primary) hypertension: Secondary | ICD-10-CM

## 2020-08-28 MED ORDER — AMLODIPINE BESYLATE 5 MG PO TABS: 5.0000 mg | ORAL_TABLET | Freq: Every day | ORAL | 3 refills | Status: DC

## 2020-08-28 MED ORDER — ROSUVASTATIN CALCIUM 5 MG PO TABS: 5.0000 mg | ORAL_TABLET | Freq: Every day | ORAL | 0 refills | Status: DC

## 2020-08-28 NOTE — Progress Notes (Signed)
Established Patient Office Visit  Subjective:  Patient ID: Alexis Parks, female    DOB: 1960/12/15  Age: 59 y.o. MRN: [id number]  CC:  Chief Complaint  Patient presents with  . Follow-up    Ct scan  . Hypertension    Getting better, bp medication is working, using BP kit.     HPI Alexis Parks presents for follow up of hypertension and CT scan. She states that she's compliant with her medication, denies side effects and checks blood pressure at home, but forgot to bring her log and continues to make healthy dietary choices. She continues to smoke 1 pack of cigarette daily and admits the desire to quit. She had Low Dose CT scan done on 08/19/2020 showed Ectatic 4.1 cm ascending thoracic aorta and Atherosclerosis. She reports that she's waiting for her financial application approval so she can schedule her Colonoscopy appointment. Overall, she states that she's doing well and offers no further complaint.  Past Medical History:  Diagnosis Date  . Allergy    seasonal  . Asthma     Past Surgical History:  Procedure Laterality Date  . AUGMENTATION MAMMAPLASTY Bilateral 10+ yrs ago   silicone  . EYE SURGERY     cataract surgery    Family History  Problem Relation Age of Onset  . Bone cancer Father   . Diabetes Maternal Grandfather     Social History   Socioeconomic History  . Marital status: Divorced    Spouse name: Not on file  . Number of children: Not on file  . Years of education: Not on file  . Highest education level: Not on file  Occupational History  . Not on file  Tobacco Use  . Smoking status: Current Every Day Smoker    Packs/day: 1.00    Years: 42.00    Pack years: 42.00    Types: Cigarettes  . Smokeless tobacco: Never Used  . Tobacco comment: "i am thinking about it"  Substance and Sexual Activity  . Alcohol use: Yes    Alcohol/week: 2.0 - 3.0 standard drinks    Types: 2 - 3 Cans of beer per week    Comment: daily  . Drug use: No  .  Sexual activity: Yes    Birth control/protection: Post-menopausal  Other Topics Concern  . Not on file  Social History Narrative  . Not on file   Social Determinants of Health   Financial Resource Strain: Not on file  Food Insecurity: Not on file  Transportation Needs: Not on file  Physical Activity: Not on file  Stress: Not on file  Social Connections: Not on file  Intimate Partner Violence: Not on file    Outpatient Medications Prior to Visit  Medication Sig Dispense Refill  . acetaminophen (TYLENOL) 325 MG tablet Take 650 mg by mouth as needed for mild pain.    Marland Kitchen albuterol (VENTOLIN HFA) 108 (90 Base) MCG/ACT inhaler Inhale 2 puffs into the lungs every 6 (six) hours as needed for wheezing or shortness of breath. 18 g 3  . Blood Pressure Monitoring (BLOOD PRESSURE KIT) DEVI 1 kit by Does not apply route daily. 1 each 0  . cetirizine (ZYRTEC) 5 MG tablet Take 1 tablet (5 mg total) by mouth daily. 30 tablet 0  . fluticasone (FLONASE) 50 MCG/ACT nasal spray Place 1 spray into both nostrils daily. 16 g 0  . Fluticasone-Salmeterol (ADVAIR) 250-50 MCG/DOSE AEPB Inhale 1 puff into the lungs daily. 60 each 3  . sulfamethoxazole-trimethoprim (  BACTRIM DS) 800-160 MG tablet Take 1 tablet by mouth 2 (two) times daily. 14 tablet 0  . Vitamin D, Ergocalciferol, (DRISDOL) 1.25 MG (50000 UNIT) CAPS capsule Take 1 capsule (50,000 Units total) by mouth every 7 (seven) days. 16 capsule 0  . amLODipine (NORVASC) 5 MG tablet Take 1 tablet (5 mg total) by mouth daily. 30 tablet 0  . Aspirin Effervescent (ALKA-SELTZER EXTRA STRENGTH PO) Take by mouth as needed. (Patient not taking: Reported on 08/28/2020)     No facility-administered medications prior to visit.    No Known Allergies  ROS Review of Systems  Constitutional: Negative.   Eyes: Negative.   Respiratory: Negative.   Cardiovascular: Negative.   Gastrointestinal: Negative.   Neurological: Negative.   Psychiatric/Behavioral: Negative.        Objective:    Physical Exam HENT:     Head: Normocephalic and atraumatic.  Eyes:     Extraocular Movements: Extraocular movements intact.     Conjunctiva/sclera: Conjunctivae normal.     Pupils: Pupils are equal, round, and reactive to light.  Cardiovascular:     Rate and Rhythm: Normal rate and regular rhythm.     Pulses: Normal pulses.     Heart sounds: Normal heart sounds.  Pulmonary:     Effort: Pulmonary effort is normal.     Breath sounds: Normal breath sounds.  Neurological:     General: No focal deficit present.     Mental Status: She is alert and oriented to person, place, and time. Mental status is at baseline.  Psychiatric:        Mood and Affect: Mood normal.        Behavior: Behavior normal.        Thought Content: Thought content normal.        Judgment: Judgment normal.     BP (!) 142/91 (BP Location: Left Arm, Patient Position: Sitting, Cuff Size: Small)   Pulse (!) 102   SpO2 93%  Wt Readings from Last 3 Encounters:  08/19/20 117 lb (53.1 kg)  08/13/20 117 lb 9.6 oz (53.3 kg)  07/09/20 114 lb 1.6 oz (51.8 kg)     Health Maintenance Due  Topic Date Due  . Hepatitis C Screening  Never done  . COVID-19 Vaccine (1) Never done  . HIV Screening  Never done  . TETANUS/TDAP  Never done  . COLONOSCOPY  Never done  . PAP SMEAR-Modifier  10/14/2019  . MAMMOGRAM  12/15/2019    There are no preventive care reminders to display for this patient.  Lab Results  Component Value Date   TSH 1.120 06/25/2020   Lab Results  Component Value Date   WBC 8.4 06/25/2020   HGB 17.0 (H) 06/25/2020   HCT 49.0 (H) 06/25/2020   MCV 101 (H) 06/25/2020   PLT 320 06/25/2020   Lab Results  Component Value Date   NA 144 08/13/2020   K 4.8 08/13/2020   CO2 22 08/13/2020   GLUCOSE 107 (H) 08/13/2020   BUN 9 08/13/2020   CREATININE 0.88 08/13/2020   BILITOT 0.5 06/25/2020   ALKPHOS 147 (H) 06/25/2020   AST 51 (H) 06/25/2020   ALT 30 06/25/2020   PROT 7.6  06/25/2020   ALBUMIN 4.4 06/25/2020   CALCIUM 10.1 08/13/2020   ANIONGAP 7 06/28/2016   Lab Results  Component Value Date   CHOL 216 (H) 06/25/2020   Lab Results  Component Value Date   HDL 94 06/25/2020   Lab Results  Component Value  Date   LDLCALC 106 (H) 06/25/2020   Lab Results  Component Value Date   TRIG 95 06/25/2020   Lab Results  Component Value Date   CHOLHDL 2.3 06/25/2020   Lab Results  Component Value Date   HGBA1C 5.0 06/25/2020      Assessment & Plan:    1. Essential hypertension- Her blood pressure is improving, her goal should be less than 140/90. She will continue on current treatment regimen, DASH diet and Smoking cessation was strongly encouraged. She was provided with [place] Quit line information. - amLODipine (NORVASC) 5 MG tablet; Take 1 tablet (5 mg total) by mouth daily.  Dispense: 30 tablet; Refill: 3  2. Ectatic aorta ([place]) - She will follow up with- Ambulatory referral to Vascular Surgery  3. Elevated lipids - The 10-year ASCVD risk score Mikey Bussing DC Jr., et al., 2013) is: 6.3%   Values used to calculate the score:     Age: 69 years     Sex: Female     Is Non-Hispanic African American: No     Diabetic: No     Tobacco smoker: Yes     Systolic Blood Pressure: 263 mmHg     Is BP treated: Yes     HDL Cholesterol: 94 mg/dL     Total Cholesterol: 216 mg/dL Though her was 6.3%, but her CT scan showed Atherosclerosis, she will start Rosuvastatin 5 mg. She was educated on medication side effects and to notify clinic. She was encouraged to continue on low fat/cholesterol diet and smoking cessation. - rosuvastatin (CRESTOR) 5 MG tablet; Take 1 tablet (5 mg total) by mouth daily.  Dispense: 30 tablet; Refill: 0    Follow-up: Return in about 1 month (around 09/30/2020), or if symptoms worsen or fail to improve.    [place] Jerold Coombe, NP

## 2020-08-28 NOTE — Patient Instructions (Signed)
Fat and Cholesterol Restricted Eating Plan Getting too much fat and cholesterol in your diet may cause health problems. Choosing the right foods helps keep your fat and cholesterol at normal levels. This can keep you from getting certain diseases. Your doctor may recommend an eating plan that includes:  Total fat: ______% or less of total calories a day.  Saturated fat: ______% or less of total calories a day.  Cholesterol: less than _________mg a day.  Fiber: ______g a day. What are tips for following this plan? Meal planning  At meals, divide your plate into four equal parts: ? Fill one-half of your plate with vegetables and green salads. ? Fill one-fourth of your plate with whole grains. ? Fill one-fourth of your plate with low-fat (lean) protein foods.  Eat fish that is high in omega-3 fats at least two times a week. This includes mackerel, tuna, sardines, and salmon.  Eat foods that are high in fiber, such as whole grains, beans, apples, broccoli, carrots, peas, and barley. General tips   Work with your doctor to lose weight if you need to.  Avoid: ? Foods with added sugar. ? Fried foods. ? Foods with partially hydrogenated oils.  Limit alcohol intake to no more than 1 drink a day for nonpregnant women and 2 drinks a day for men. One drink equals 12 oz of beer, 5 oz of wine, or 1 oz of hard liquor. Reading food labels  Check food labels for: ? Trans fats. ? Partially hydrogenated oils. ? Saturated fat (g) in each serving. ? Cholesterol (mg) in each serving. ? Fiber (g) in each serving.  Choose foods with healthy fats, such as: ? Monounsaturated fats. ? Polyunsaturated fats. ? Omega-3 fats.  Choose grain products that have whole grains. Look for the word "whole" as the first word in the ingredient list. Cooking  Cook foods using low-fat methods. These include baking, boiling, grilling, and broiling.  Eat more home-cooked foods. Eat at restaurants and buffets  less often.  Avoid cooking using saturated fats, such as butter, cream, palm oil, palm kernel oil, and coconut oil. Recommended foods  Fruits  All fresh, canned (in natural juice), or frozen fruits. Vegetables  Fresh or frozen vegetables (raw, steamed, roasted, or grilled). Green salads. Grains  Whole grains, such as whole wheat or whole grain breads, crackers, cereals, and pasta. Unsweetened oatmeal, bulgur, barley, quinoa, or brown rice. Corn or whole wheat flour tortillas. Meats and other protein foods  Ground beef (85% or leaner), grass-fed beef, or beef trimmed of fat. Skinless chicken or turkey. Ground chicken or turkey. Pork trimmed of fat. All fish and seafood. Egg whites. Dried beans, peas, or lentils. Unsalted nuts or seeds. Unsalted canned beans. Nut butters without added sugar or oil. Dairy  Low-fat or nonfat dairy products, such as skim or 1% milk, 2% or reduced-fat cheeses, low-fat and fat-free ricotta or cottage cheese, or plain low-fat and nonfat yogurt. Fats and oils  Tub margarine without trans fats. Light or reduced-fat mayonnaise and salad dressings. Avocado. Olive, canola, sesame, or safflower oils. The items listed above may not be a complete list of foods and beverages you can eat. Contact a dietitian for more information. Foods to avoid Fruits  Canned fruit in heavy syrup. Fruit in cream or butter sauce. Fried fruit. Vegetables  Vegetables cooked in cheese, cream, or butter sauce. Fried vegetables. Grains  White bread. White pasta. White rice. Cornbread. Bagels, pastries, and croissants. Crackers and snack foods that contain trans fat   and hydrogenated oils. Meats and other protein foods  Fatty cuts of meat. Ribs, chicken wings, bacon, sausage, bologna, salami, chitterlings, fatback, hot dogs, bratwurst, and packaged lunch meats. Liver and organ meats. Whole eggs and egg yolks. Chicken and [place] with skin. Fried meat. Dairy  Whole or 2% milk, cream,  half-and-half, and cream cheese. Whole milk cheeses. Whole-fat or sweetened yogurt. Full-fat cheeses. Nondairy creamers and whipped toppings. Processed cheese, cheese spreads, and cheese curds. Beverages  Alcohol. Sugar-sweetened drinks such as sodas, lemonade, and fruit drinks. Fats and oils  Butter, stick margarine, lard, shortening, ghee, or bacon fat. Coconut, palm kernel, and palm oils. Sweets and desserts  Corn syrup, sugars, honey, and molasses. Candy. Jam and jelly. Syrup. Sweetened cereals. Cookies, pies, cakes, donuts, muffins, and ice cream. The items listed above may not be a complete list of foods and beverages you should avoid. Contact a dietitian for more information. Summary  Choosing the right foods helps keep your fat and cholesterol at normal levels. This can keep you from getting certain diseases.  At meals, fill one-half of your plate with vegetables and green salads.  Eat high-fiber foods, like whole grains, beans, apples, carrots, peas, and barley.  Limit added sugar, saturated fats, alcohol, and fried foods. This information is not intended to replace advice given to you by your health care provider. Make sure you discuss any questions you have with your health care provider. Document Revised: 05/10/2018 Document Reviewed: 05/24/2017 Elsevier Patient Education  [place]. Smoking Tobacco Information, Adult Smoking tobacco can be harmful to your health. Tobacco contains a poisonous (toxic), colorless chemical called nicotine. Nicotine is addictive. It changes the brain and can make it hard to stop smoking. Tobacco also has other toxic chemicals that can hurt your body and raise your risk of many cancers. How can smoking tobacco affect me? Smoking tobacco puts you at risk for:  Cancer. Smoking is most commonly associated with lung cancer, but can also lead to cancer in other parts of the body.  Chronic obstructive pulmonary disease (COPD). This is a  long-term lung condition that makes it hard to breathe. It also gets worse over time.  High blood pressure (hypertension), heart disease, stroke, or heart attack.  Lung infections, such as pneumonia.  Cataracts. This is when the lenses in the eyes become clouded.  Digestive problems. This may include peptic ulcers, heartburn, and gastroesophageal reflux disease (GERD).  Oral health problems, such as gum disease and tooth loss.  Loss of taste and smell. Smoking can affect your appearance by causing:  Wrinkles.  Yellow or stained teeth, fingers, and fingernails. Smoking tobacco can also affect your social life, because:  It may be challenging to find places to smoke when away from home. Many workplaces, [employer], hotels, and public places are tobacco-free.  Smoking is expensive. This is due to the cost of tobacco and the long-term costs of treating health problems from smoking.  Secondhand smoke may affect those around you. Secondhand smoke can cause lung cancer, breathing problems, and heart disease. Children of smokers have a higher risk for: ? Sudden infant death syndrome (SIDS). ? Ear infections. ? Lung infections. If you currently smoke tobacco, quitting now can help you:  Lead a longer and healthier life.  Look, smell, breathe, and feel better over time.  Save money.  Protect others from the harms of secondhand smoke. What actions can I take to prevent health problems? Quit smoking   Do not start smoking. Quit if you  already do.  Make a plan to quit smoking and commit to it. Look for programs to help you and ask your health care provider for recommendations and ideas.  Set a date and write down all the reasons you want to quit.  Let your friends and family know you are quitting so they can help and support you. Consider finding friends who also want to quit. It can be easier to quit with someone else, so that you can support each other.  Talk with your health  care provider about using nicotine replacement medicines to help you quit, such as gum, lozenges, patches, sprays, or pills.  Do not replace cigarette smoking with electronic cigarettes, which are commonly called e-cigarettes. The safety of e-cigarettes is not known, and some may contain harmful chemicals.  If you try to quit but return to smoking, stay positive. It is common to slip up when you first quit, so take it one day at a time.  Be prepared for cravings. When you feel the urge to smoke, chew gum or suck on hard candy. Lifestyle  Stay busy and take care of your body.  Drink enough fluid to keep your urine pale yellow.  Get plenty of exercise and eat a healthy diet. This can help prevent weight gain after quitting.  Monitor your eating habits. Quitting smoking can cause you to have a larger appetite than when you smoke.  Find ways to relax. Go out with friends or family to a movie or a restaurant where people do not smoke.  Ask your health care provider about having regular tests (screenings) to check for cancer. This may include blood tests, imaging tests, and other tests.  Find ways to manage your stress, such as meditation, yoga, or exercise. Where to find support To get support to quit smoking, consider:  Asking your health care provider for more information and resources.  Taking classes to learn more about quitting smoking.  Looking for local organizations that offer resources about quitting smoking.  Joining a support group for people who want to quit smoking in your local community.  Calling the smokefree.gov counselor helpline: 1-800-Quit-Now 3513268146) Where to find more information You may find more information about quitting smoking from:  HelpGuide.org: www.helpguide.org  BankRights.uy: smokefree.gov  American Lung Association: www.lung.org Contact a health care provider if you:  Have problems breathing.  Notice that your lips, nose, or fingers  turn blue.  Have chest pain.  Are coughing up blood.  Feel faint or you pass out.  Have other health changes that cause you to worry. Summary  Smoking tobacco can negatively affect your health, the health of those around you, your finances, and your social life.  Do not start smoking. Quit if you already do. If you need help quitting, ask your health care provider.  Think about joining a support group for people who want to quit smoking in your local community. There are many effective programs that will help you to quit this behavior. This information is not intended to replace advice given to you by your health care provider. Make sure you discuss any questions you have with your health care provider. Document Revised: 06/01/2019 Document Reviewed: 09/21/2016 Elsevier Patient Education  2020 [employer] DASH Eating Plan DASH stands for "Dietary Approaches to Stop Hypertension." The DASH eating plan is a healthy eating plan that has been shown to reduce high blood pressure (hypertension). It may also reduce your risk for type 2 diabetes, heart disease, and stroke. The  DASH eating plan may also help with weight loss. What are tips for following this plan?  General guidelines  Avoid eating more than 2,300 mg (milligrams) of salt (sodium) a day. If you have hypertension, you may need to reduce your sodium intake to 1,500 mg a day.  Limit alcohol intake to no more than 1 drink a day for nonpregnant women and 2 drinks a day for men. One drink equals 12 oz of beer, 5 oz of wine, or 1 oz of hard liquor.  Work with your health care provider to maintain a healthy body weight or to lose weight. Ask what an ideal weight is for you.  Get at least 30 minutes of exercise that causes your heart to beat faster (aerobic exercise) most days of the week. Activities may include walking, swimming, or biking.  Work with your health care provider or diet and nutrition specialist (dietitian) to adjust  your eating plan to your individual calorie needs. Reading food labels   Check food labels for the amount of sodium per serving. Choose foods with less than 5 percent of the Daily Value of sodium. Generally, foods with less than 300 mg of sodium per serving fit into this eating plan.  To find whole grains, look for the word "whole" as the first word in the ingredient list. Shopping  Buy products labeled as "low-sodium" or "no salt added."  Buy fresh foods. Avoid canned foods and premade or frozen meals. Cooking  Avoid adding salt when cooking. Use salt-free seasonings or herbs instead of table salt or sea salt. Check with your health care provider or pharmacist before using salt substitutes.  Do not fry foods. Cook foods using healthy methods such as baking, boiling, grilling, and broiling instead.  Cook with heart-healthy oils, such as olive, canola, soybean, or sunflower oil. Meal planning  Eat a balanced diet that includes: ? 5 or more servings of fruits and vegetables each day. At each meal, try to fill half of your plate with fruits and vegetables. ? Up to 6-8 servings of whole grains each day. ? Less than 6 oz of lean meat, poultry, or fish each day. A 3-oz serving of meat is about the same size as a deck of cards. One egg equals 1 oz. ? 2 servings of low-fat dairy each day. ? A serving of nuts, seeds, or beans 5 times each week. ? Heart-healthy fats. Healthy fats called Omega-3 fatty acids are found in foods such as flaxseeds and coldwater fish, like sardines, salmon, and mackerel.  Limit how much you eat of the following: ? Canned or prepackaged foods. ? Food that is high in trans fat, such as fried foods. ? Food that is high in saturated fat, such as fatty meat. ? Sweets, desserts, sugary drinks, and other foods with added sugar. ? Full-fat dairy products.  Do not salt foods before eating.  Try to eat at least 2 vegetarian meals each week.  Eat more home-cooked food  and less restaurant, buffet, and fast food.  When eating at a restaurant, ask that your food be prepared with less salt or no salt, if possible. What foods are recommended? The items listed may not be a complete list. Talk with your dietitian about what dietary choices are best for you. Grains Whole-grain or whole-wheat bread. Whole-grain or whole-wheat pasta. Brown rice. Orpah Cobb. Bulgur. Whole-grain and low-sodium cereals. Pita bread. Low-fat, low-sodium crackers. Whole-wheat flour tortillas. Vegetables Fresh or frozen vegetables (raw, steamed, roasted, or grilled). Low-sodium  or reduced-sodium tomato and vegetable juice. Low-sodium or reduced-sodium tomato sauce and tomato paste. Low-sodium or reduced-sodium canned vegetables. Fruits All fresh, dried, or frozen fruit. Canned fruit in natural juice (without added sugar). Meat and other protein foods Skinless chicken or [place]. Ground chicken or [place]. Pork with fat trimmed off. Fish and seafood. Egg whites. Dried beans, peas, or lentils. Unsalted nuts, nut butters, and seeds. Unsalted canned beans. Lean cuts of beef with fat trimmed off. Low-sodium, lean deli meat. Dairy Low-fat (1%) or fat-free (skim) milk. Fat-free, low-fat, or reduced-fat cheeses. Nonfat, low-sodium ricotta or cottage cheese. Low-fat or nonfat yogurt. Low-fat, low-sodium cheese. Fats and oils Soft margarine without trans fats. Vegetable oil. Low-fat, reduced-fat, or light mayonnaise and salad dressings (reduced-sodium). Canola, safflower, olive, soybean, and sunflower oils. Avocado. Seasoning and other foods Herbs. Spices. Seasoning mixes without salt. Unsalted popcorn and pretzels. Fat-free sweets. What foods are not recommended? The items listed may not be a complete list. Talk with your dietitian about what dietary choices are best for you. Grains Baked goods made with fat, such as croissants, muffins, or some breads. Dry pasta or rice meal  packs. Vegetables Creamed or fried vegetables. Vegetables in a cheese sauce. Regular canned vegetables (not low-sodium or reduced-sodium). Regular canned tomato sauce and paste (not low-sodium or reduced-sodium). Regular tomato and vegetable juice (not low-sodium or reduced-sodium). Rosita Fire. Olives. Fruits Canned fruit in a light or heavy syrup. Fried fruit. Fruit in cream or butter sauce. Meat and other protein foods Fatty cuts of meat. Ribs. Fried meat. Tomasa Blase. Sausage. Bologna and other processed lunch meats. Salami. Fatback. Hotdogs. Bratwurst. Salted nuts and seeds. Canned beans with added salt. Canned or smoked fish. Whole eggs or egg yolks. Chicken or [place] with skin. Dairy Whole or 2% milk, cream, and half-and-half. Whole or full-fat cream cheese. Whole-fat or sweetened yogurt. Full-fat cheese. Nondairy creamers. Whipped toppings. Processed cheese and cheese spreads. Fats and oils Butter. Stick margarine. Lard. Shortening. Ghee. Bacon fat. Tropical oils, such as coconut, palm kernel, or palm oil. Seasoning and other foods Salted popcorn and pretzels. Onion salt, garlic salt, seasoned salt, table salt, and sea salt. Worcestershire sauce. Tartar sauce. Barbecue sauce. Teriyaki sauce. Soy sauce, including reduced-sodium. Steak sauce. Canned and packaged gravies. Fish sauce. Oyster sauce. Cocktail sauce. Horseradish that you find on the shelf. Ketchup. Mustard. Meat flavorings and tenderizers. Bouillon cubes. Hot sauce and Tabasco sauce. Premade or packaged marinades. Premade or packaged taco seasonings. Relishes. Regular salad dressings. Where to find more information:  National Heart, Lung, and Blood Institute: PopSteam.is  American Heart Association: www.heart.org Summary  The DASH eating plan is a healthy eating plan that has been shown to reduce high blood pressure (hypertension). It may also reduce your risk for type 2 diabetes, heart disease, and stroke.  With the DASH eating  plan, you should limit salt (sodium) intake to 2,300 mg a day. If you have hypertension, you may need to reduce your sodium intake to 1,500 mg a day.  When on the DASH eating plan, aim to eat more fresh fruits and vegetables, whole grains, lean proteins, low-fat dairy, and heart-healthy fats.  Work with your health care provider or diet and nutrition specialist (dietitian) to adjust your eating plan to your individual calorie needs. This information is not intended to replace advice given to you by your health care provider. Make sure you discuss any questions you have with your health care provider. Document Revised: 08/19/2017 Document Reviewed: 08/30/2016 Elsevier Patient Education  2020 Elsevier  Inc.  

## 2020-09-05 ENCOUNTER — Telehealth: Payer: Self-pay | Admitting: Pharmacist

## 2020-09-05 NOTE — Telephone Encounter (Signed)
09/05/2020 12:45:54 PM - Advair refill online with [facility]  -- Rhetta Mura - Friday, September 05, 2020 12:45 PM --Placed refill online UP#[id number] 250/[id number]--order# [id number].

## 2020-09-24 ENCOUNTER — Other Ambulatory Visit: Payer: Self-pay | Admitting: Gerontology

## 2020-09-24 DIAGNOSIS — Z8709 Personal history of other diseases of the respiratory system: Secondary | ICD-10-CM

## 2020-09-25 ENCOUNTER — Ambulatory Visit: Payer: Self-pay | Admitting: General Surgery

## 2020-09-29 ENCOUNTER — Telehealth: Payer: Self-pay | Admitting: Pharmacist

## 2020-09-29 NOTE — Telephone Encounter (Signed)
09/29/2020 8:24:58 AM - ProAir refill to [facility]  -- Rhetta Mura - Monday, September 29, 2020 8:24 AM --Received [employer] form for refill on ProAir, checked and faxed back for processing.

## 2020-09-30 ENCOUNTER — Other Ambulatory Visit: Payer: Self-pay | Admitting: Gerontology

## 2020-09-30 ENCOUNTER — Ambulatory Visit (INDEPENDENT_AMBULATORY_CARE_PROVIDER_SITE_OTHER): Payer: Self-pay | Admitting: General Surgery

## 2020-09-30 ENCOUNTER — Telehealth: Payer: Self-pay

## 2020-09-30 ENCOUNTER — Encounter: Payer: Self-pay | Admitting: General Surgery

## 2020-09-30 ENCOUNTER — Ambulatory Visit: Payer: Self-pay | Admitting: Gerontology

## 2020-09-30 ENCOUNTER — Other Ambulatory Visit: Payer: Self-pay

## 2020-09-30 VITALS — BP 156/89 | HR 101 | Temp 98.2°F | Ht 64.5 in | Wt 114.6 lb

## 2020-09-30 DIAGNOSIS — R2242 Localized swelling, mass and lump, left lower limb: Secondary | ICD-10-CM

## 2020-09-30 DIAGNOSIS — I77819 Aortic ectasia, unspecified site: Secondary | ICD-10-CM

## 2020-09-30 NOTE — Telephone Encounter (Signed)
Pt is scheduled for an [place] at [facility] 10/06/2020 @ 10:30am.  Pt notified. Verbalizes understanding.

## 2020-09-30 NOTE — Patient Instructions (Signed)
An ultrasound will be scheduled for you at [facility]. I will call you with your appointment. We will call you with the results. If you have any concerns or questions, please feel free to call our office.

## 2020-09-30 NOTE — Progress Notes (Signed)
Patient ID: Alexis Parks, female   DOB: [birth year], 60 y.o.   MRN: [id number]  Chief Complaint  Patient presents with  . New Patient (Initial Visit)    Left lower cyst    HPI Alexis Parks is a 60 y.o. female.   She has been referred by her primary care provider for further evaluation of a soft mass on her left lower extremity.  The patient states that it has been present for at least a couple of years.  She states that when she is on her feet for prolonged periods of time, she notices that her leg swells and cramps, and this does not occur in the other leg.  She says that the mass has never become hot or red.  It has never drained any material.  She says that it is slightly tender and that she cannot wear socks that hit along this area.  She denies any prior trauma to the location.  She says that she was going to just live with it, but her medical provider recommended that she have it evaluated.   Past Medical History:  Diagnosis Date  . Allergy    seasonal  . Asthma     Past Surgical History:  Procedure Laterality Date  . AUGMENTATION MAMMAPLASTY Bilateral 46+ yrs ago   silicone  . EYE SURGERY     cataract surgery    Family History  Problem Relation Age of Onset  . Bone cancer Father   . Diabetes Maternal Grandfather     Social History Social History   Tobacco Use  . Smoking status: Current Every Day Smoker    Packs/day: 1.00    Years: 42.00    Pack years: 42.00    Types: Cigarettes  . Smokeless tobacco: Never Used  . Tobacco comment: "i am thinking about it"  Substance Use Topics  . Alcohol use: Yes    Alcohol/week: 2.0 - 3.0 standard drinks    Types: 2 - 3 Cans of beer per week    Comment: daily  . Drug use: No    No Known Allergies  Current Outpatient Medications  Medication Sig Dispense Refill  . acetaminophen (TYLENOL) 325 MG tablet Take 650 mg by mouth as needed for mild pain.    Marland Kitchen ADVAIR DISKUS 250-50 MCG/DOSE AEPB INHALE 1 PUFF BY MOUTH 2  TIMES A DAY 180 each 0  . albuterol (VENTOLIN HFA) 108 (90 Base) MCG/ACT inhaler Inhale 2 puffs into the lungs every 6 (six) hours as needed for wheezing or shortness of breath. 18 g 3  . amLODipine (NORVASC) 5 MG tablet Take 1 tablet (5 mg total) by mouth daily. 30 tablet 3  . Blood Pressure Monitoring (BLOOD PRESSURE KIT) DEVI 1 kit by Does not apply route daily. 1 each 0  . cetirizine (ZYRTEC) 5 MG tablet Take 1 tablet (5 mg total) by mouth daily. 30 tablet 0  . fluticasone (FLONASE) 50 MCG/ACT nasal spray Place 1 spray into both nostrils daily. 16 g 0  . Vitamin D, Ergocalciferol, (DRISDOL) 1.25 MG (50000 UNIT) CAPS capsule Take 1 capsule (50,000 Units total) by mouth every 7 (seven) days. 16 capsule 0  . rosuvastatin (CRESTOR) 5 MG tablet Take 1 tablet (5 mg total) by mouth daily. (Patient not taking: Reported on 09/30/2020) 30 tablet 0   No current facility-administered medications for this visit.    Review of Systems Review of Systems  Constitutional: Positive for chills, fatigue and fever.  Eyes: Positive for visual disturbance.  Respiratory: Positive for cough, shortness of breath, wheezing and stridor.   Cardiovascular: Positive for leg swelling.       Cramping pain in legs  Neurological: Positive for dizziness and headaches.  Psychiatric/Behavioral: Positive for dysphoric mood.  All other systems reviewed and are negative.   Blood pressure (!) 156/89, pulse (!) 101, temperature 98.2 F (36.8 C), temperature source Oral, height 5' 4.5" (1.638 m), weight 114 lb 9.6 oz (52 kg), SpO2 94 %.  Physical Exam Physical Exam Constitutional:      General: She is not in acute distress.    Appearance: Normal appearance. She is normal weight.  HENT:     Head: Normocephalic and atraumatic.     Nose:     Comments: Covered with a mask    Mouth/Throat:     Comments: Covered with a mask Eyes:     General: No scleral icterus.       Left eye: No discharge.  Cardiovascular:     Rate and  Rhythm: Regular rhythm. Tachycardia present.     Comments: Pedal pulses faint. Pulmonary:     Effort: Pulmonary effort is normal. No respiratory distress.     Breath sounds: Wheezing present.     Comments: End expiratory wheezes throughout all lung fields Abdominal:     General: Abdomen is flat. Bowel sounds are normal.     Palpations: Abdomen is soft.  Genitourinary:    Comments: Deferred Musculoskeletal:     Cervical back: No rigidity.       Legs:     Comments: The area of concern is soft and does not seem to be a discrete mass, such as a cyst or lipoma.  There is some purpleish discoloration, however this is faint and could simply be within the skin layer,   Lymphadenopathy:     Cervical: No cervical adenopathy.  Skin:    General: Skin is warm and dry.  Neurological:     General: No focal deficit present.     Mental Status: She is alert and oriented to person, place, and time.  Psychiatric:        Mood and Affect: Mood normal.        Behavior: Behavior normal.     Data Reviewed I reviewed the electronic medical record.  The only commentary regarding this finding was found in the patient's initial visit with Ms. Iloabachie, on June 29, 2020.  This resulted in her referral to general surgery.  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  She has not had any other vascular studies.  Assessment This is a 60 year old woman with a small soft mass on her left lower extremity near the ankle.  I am unable to discern what it might consist of as the physical exam findings are not classic for lipoma or an epidermal inclusion cyst.  It may be vascular in nature, as the patient describes cramping and swelling in the left leg with increased activity, which is not present in the right leg.  She does have an extensive smoking history and these symptoms could potentially reflect arterial insufficiency, but in that case, I do  not think this small mass is related.  Plan I have ordered a soft tissue ultrasound of the area to try and better determine what this mass is.  It may not require any surgical intervention, but more data are necessary.  I will contact the patient once I have those results.  Fredirick Maudlin 09/30/2020, 1:58 PM

## 2020-09-30 NOTE — Progress Notes (Signed)
us

## 2020-10-06 ENCOUNTER — Ambulatory Visit: Payer: Self-pay

## 2020-10-08 ENCOUNTER — Other Ambulatory Visit: Payer: Self-pay | Admitting: Gerontology

## 2020-10-08 ENCOUNTER — Ambulatory Visit: Payer: Self-pay | Admitting: Pharmacist

## 2020-10-08 ENCOUNTER — Other Ambulatory Visit: Payer: Self-pay

## 2020-10-08 DIAGNOSIS — Z79899 Other long term (current) drug therapy: Secondary | ICD-10-CM

## 2020-10-08 DIAGNOSIS — E559 Vitamin D deficiency, unspecified: Secondary | ICD-10-CM

## 2020-10-08 NOTE — Progress Notes (Signed)
Medication Management Clinic Visit Note  Patient: Alexis Parks MRN: [id number] Date of Birth: Jan 06, 1961 PCP: Alexis Pac, FNP   [name] 60 y.o. female presents for a telephone visit for medication management today. Verified patient with two identifiers.  There were no vitals taken for this visit.  Patient Information   Past Medical History:  Diagnosis Date  . Allergy    seasonal  . Asthma       Past Surgical History:  Procedure Laterality Date  . AUGMENTATION MAMMAPLASTY Bilateral 68+ yrs ago   silicone  . EYE SURGERY     cataract surgery     Family History  Problem Relation Age of Onset  . Bone cancer Father   . Diabetes Maternal Grandfather     Family Support: Good  Lifestyle Diet: Breakfast: eggs or leftover from night before Lunch: soups  Dinner: chicken and veggies (always has a vegetable) Drinks: water, sometimes mountain dew    Current Exercise Habits: Home exercise routine, Type of exercise: stretching, Time (Minutes): 10, Frequency (Times/Week): 3, Weekly Exercise (Minutes/Week): 30, Intensity: Mild       Social History   Substance and Sexual Activity  Alcohol Use Yes  . Alcohol/week: 1.0 - 2.0 standard drink  . Types: 1 - 2 Cans of beer per week   Comment: every other day; 3-4 on weekends      Social History   Tobacco Use  Smoking Status Current Every Day Smoker  . Packs/day: 1.00  . Years: 42.00  . Pack years: 42.00  . Types: Cigarettes  Smokeless Tobacco Never Used  Tobacco Comment   "i am thinking about it"      Health Maintenance  Topic Date Due  . Hepatitis C Screening  Never done  . COVID-19 Vaccine (1) Never done  . HIV Screening  Never done  . TETANUS/TDAP  Never done  . COLONOSCOPY (Pts 45-18yr Insurance coverage will need to be confirmed)  Never done  . PAP SMEAR-Modifier  10/14/2019  . MAMMOGRAM  12/15/2019  . INFLUENZA VACCINE  Completed   Health Maintenance/Date Completed  Last ED visit:  05/16/2020 Last Visit to PCP: 09/30/2020 Next Visit to PCP: pt waiting to hear back  Specialist Visit: 10/14/2020 - BLE [place] several years Pelvic/PAP Exam: 10/14/2019 Mammogram: 12/15/2019 Colonoscopy: scheduled for some time but unsure of exact date  Flu Vaccine: completed Pneumonia Vaccine: due COVID-19 Vaccine: completed primary series, plans to get booster   Outpatient Encounter Medications as of 10/08/2020  Medication Sig  . acetaminophen (TYLENOL) 325 MG tablet Take 650 mg by mouth as needed for mild pain.  .Marland KitchenADVAIR DISKUS 250-50 MCG/DOSE AEPB INHALE 1 PUFF BY MOUTH 2 TIMES A DAY  . albuterol (VENTOLIN HFA) 108 (90 Base) MCG/ACT inhaler Inhale 2 puffs into the lungs every 6 (six) hours as needed for wheezing or shortness of breath.  .Marland KitchenamLODipine (NORVASC) 5 MG tablet Take 1 tablet (5 mg total) by mouth daily.  . Blood Pressure Monitoring (BLOOD PRESSURE KIT) DEVI 1 kit by Does not apply route daily.  . cetirizine (ZYRTEC) 5 MG tablet Take 1 tablet (5 mg total) by mouth daily. (Patient taking differently: Take 5 mg by mouth daily as needed.)  . fluticasone (FLONASE) 50 MCG/ACT nasal spray Place 1 spray into both nostrils daily. (Patient taking differently: Place 2 sprays into both nostrils daily as needed.)  . Vitamin D, Ergocalciferol, (DRISDOL) 1.25 MG (50000 UNIT) CAPS capsule Take 1 capsule (50,000 Units total) by mouth  every 7 (seven) days.  . rosuvastatin (CRESTOR) 5 MG tablet Take 1 tablet (5 mg total) by mouth daily. (Patient not taking: Reported on 10/08/2020)   No facility-administered encounter medications on file as of 10/08/2020.     Assessment and Plan: HTN Pt currently takes amlodipine for BP. She checks her blood pressure once or twice daily and it has been ranging from 128/82-144/91 over the last few weeks with more reading being <130/80. Pt does eat relatively healthy and she is a current smoker. She is not yet ready to quit and smokes 1ppd. Counseled pt on  benefits of quitting smoking for her other health conditions. Encouraged pt to continue her diet and exercise (recommended to try and increase the intensity of her workouts) and continue to keep checking her BP.   HLD Pt was recently started on Crestor but has not yet started taking it because it was not filled the last time she came in to pick it up. Will fill today as pt plans to come in to pick up her other medications. See HTN for diet and exercise recommendations.   Asthma Pt asthma is well controlled with her Advair and Albuterol. She has not been taking the Advair BID - she reports taking it every other day. She also reports only using her rescue albuterol inhaler once over 2-3 weeks. Encouraged pt to bring this up at her next appt as she may be able to change her regimen since her asthma seems to be controlled and would also help with compliance.   Tobacco use She is not yet ready to quit and smokes 1ppd. Counseled pt on benefits of quitting smoking for her other health conditions.   Allergies Pt has seasonal allergies and has not needed to use her cetirizine or fluticasone nasal spray very often. Pt did not have any other complaints at this time.   Access/Adherence Pt sets her medications out but does admit to being noncompliant at times. Encouraged pt to keep her medications in a more obvious place to remind her to take her meds. She expresses she would like to get off of some of these medications. Educated her on proper diet and exercise changes and smoking cessation that could help with her current medical conditions with the possibility of coming off of the medications if her labs/vitals improve.     Sherilyn Banker, PharmD Pharmacy Resident  10/08/2020 2:11 PM

## 2020-10-10 ENCOUNTER — Other Ambulatory Visit: Payer: Self-pay | Admitting: Gerontology

## 2020-10-10 DIAGNOSIS — E559 Vitamin D deficiency, unspecified: Secondary | ICD-10-CM

## 2020-10-14 ENCOUNTER — Ambulatory Visit
Admission: RE | Admit: 2020-10-14 | Discharge: 2020-10-14 | Disposition: A | Discharge status: Home / Self Care | Payer: Self-pay | Source: Ambulatory Visit | Attending: General Surgery | Admitting: General Surgery

## 2020-10-14 ENCOUNTER — Other Ambulatory Visit: Payer: Self-pay

## 2020-10-14 DIAGNOSIS — R2242 Localized swelling, mass and lump, left lower limb: Secondary | ICD-10-CM | POA: Insufficient documentation

## 2020-10-23 ENCOUNTER — Telehealth: Payer: Self-pay | Admitting: General Surgery

## 2020-10-23 NOTE — Telephone Encounter (Signed)
Called to discuss results of ultrasound. No answer. LM on mobile VM for her to return my call.

## 2020-10-28 ENCOUNTER — Telehealth: Payer: Self-pay | Admitting: General Surgery

## 2020-10-28 NOTE — Telephone Encounter (Signed)
Patient called to get [place] results. Reviewed them in detail with her, specifically that no discrete mass or cyst was identified. She continued to express concern that "something has to be going on" because the area *is* tender.  I offered to arrange an IR biopsy of the site, rather than subject her to open surgery for a possible negative exploration. She seemed satisfied with this.

## 2020-10-29 ENCOUNTER — Telehealth: Payer: Self-pay | Admitting: General Surgery

## 2020-10-29 ENCOUNTER — Other Ambulatory Visit: Payer: Self-pay | Admitting: Surgery

## 2020-10-29 ENCOUNTER — Encounter: Payer: Self-pay | Admitting: General Surgery

## 2020-10-29 ENCOUNTER — Telehealth: Payer: Self-pay

## 2020-10-29 NOTE — Telephone Encounter (Signed)
Spoke with patient and relayed that radiology did not feel like there was anything to target with FNAB and also reiterated that I didn't feel like there was anything I could offer her surgically. I acknowledged that she has discomfort in the area, but that I didn't have an operative solution for her.

## 2020-10-29 NOTE — Telephone Encounter (Signed)
Error

## 2020-10-30 ENCOUNTER — Telehealth: Payer: Self-pay

## 2020-10-30 NOTE — Telephone Encounter (Signed)
Patient spoke with DR.Cannon yesterday to let her know there was no operative solution-patient sent a message through my chart asking why its painful to touch- patient was instructed to follow up with PCP.

## 2020-11-04 ENCOUNTER — Ambulatory Visit: Payer: Self-pay | Admitting: Gerontology

## 2020-11-10 ENCOUNTER — Encounter: Payer: Self-pay | Admitting: Cardiothoracic Surgery

## 2020-11-11 ENCOUNTER — Telehealth: Payer: Self-pay

## 2020-11-11 NOTE — Telephone Encounter (Signed)
Rescheduled f/u appt

## 2020-11-27 ENCOUNTER — Ambulatory Visit: Payer: Self-pay | Admitting: Gerontology

## 2020-11-27 ENCOUNTER — Other Ambulatory Visit: Payer: Self-pay

## 2020-11-27 ENCOUNTER — Encounter: Payer: Self-pay | Admitting: Gerontology

## 2020-11-27 ENCOUNTER — Other Ambulatory Visit: Payer: Self-pay | Admitting: Gerontology

## 2020-11-27 DIAGNOSIS — R4589 Other symptoms and signs involving emotional state: Secondary | ICD-10-CM

## 2020-11-27 DIAGNOSIS — T7840XD Allergy, unspecified, subsequent encounter: Secondary | ICD-10-CM

## 2020-11-27 DIAGNOSIS — I10 Essential (primary) hypertension: Secondary | ICD-10-CM

## 2020-11-27 DIAGNOSIS — E785 Hyperlipidemia, unspecified: Secondary | ICD-10-CM

## 2020-11-27 DIAGNOSIS — E559 Vitamin D deficiency, unspecified: Secondary | ICD-10-CM

## 2020-11-27 DIAGNOSIS — F17200 Nicotine dependence, unspecified, uncomplicated: Secondary | ICD-10-CM

## 2020-11-27 MED ORDER — VITAMIN D (ERGOCALCIFEROL) 1.25 MG (50000 UNIT) PO CAPS: 50000.0000 [IU] | ORAL_CAPSULE | ORAL | 0 refills | Status: DC

## 2020-11-27 MED ORDER — ROSUVASTATIN CALCIUM 5 MG PO TABS: 5.0000 mg | ORAL_TABLET | Freq: Every day | ORAL | 2 refills | Status: DC

## 2020-11-27 MED ORDER — AMLODIPINE BESYLATE 5 MG PO TABS: 5.0000 mg | ORAL_TABLET | Freq: Every day | ORAL | 3 refills | Status: DC

## 2020-11-27 MED ORDER — CETIRIZINE HCL 5 MG PO TABS: 5.0000 mg | ORAL_TABLET | Freq: Every day | ORAL | 0 refills | Status: DC

## 2020-11-27 NOTE — Progress Notes (Signed)
Established Patient Office Visit  Subjective:  Patient ID: Alexis Parks, female    DOB: 12-Aug-1961  Age: 60 y.o. MRN: [id number]  CC:  Chief Complaint  Patient presents with  . Fatigue    Very fatigued, low energy, requesting more vitamin D    HPI Alexis Parks presents for routine follow up. She states that she has no energy, been feeling depressed, and has not worked for 2 months. She denies suicidal nor homicidal ideation. 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 She denies chest pain, palpitation, and abdominal pain. [place] to left lower extremity on 10/14/20 Slight asymmetric thickening of subcutaneous soft tissues of left lower leg without definitive mass or cyst. She continues to smoke a pack of cigarette daily and admits the desire to quit. She was seen by Ophthalmologist and was referred to [facility]. Overall, she states that she's doing well and offers no further complaint.  Past Medical History:  Diagnosis Date  . Allergy    seasonal  . Asthma     Past Surgical History:  Procedure Laterality Date  . AUGMENTATION MAMMAPLASTY Bilateral 96+ yrs ago   silicone  . EYE SURGERY     cataract surgery    Family History  Problem Relation Age of Onset  . Bone cancer Father   . Diabetes Maternal Grandfather     Social History   Socioeconomic History  . Marital status: Divorced    Spouse name: Not on file  . Number of children: Not on file  . Years of education: Not on file  . Highest education level: Not on file  Occupational History  . Not on file  Tobacco Use  . Smoking status: Current Every Day Smoker    Packs/day: 1.00    Years: 42.00    Pack years: 42.00    Types: Cigarettes  . Smokeless tobacco: Never Used  . Tobacco comment: "i am thinking about it"  Substance and Sexual Activity  . Alcohol use: Yes    Alcohol/week: 1.0 - 2.0 standard drink    Types: 1 - 2  Cans of beer per week    Comment: every other day; 3-4 on weekends  . Drug use: No  . Sexual activity: Yes    Birth control/protection: Post-menopausal  Other Topics Concern  . Not on file  Social History Narrative  . Not on file   Social Determinants of Health   Financial Resource Strain: Not on file  Food Insecurity: Not on file  Transportation Needs: Not on file  Physical Activity: Not on file  Stress: Not on file  Social Connections: Not on file  Intimate Partner Violence: Not on file    Outpatient Medications Prior to Visit  Medication Sig Dispense Refill  . ADVAIR DISKUS 250-50 MCG/DOSE AEPB INHALE 1 PUFF BY MOUTH 2 TIMES A DAY 180 each 0  . albuterol (VENTOLIN HFA) 108 (90 Base) MCG/ACT inhaler Inhale 2 puffs into the lungs every 6 (six) hours as needed for wheezing or shortness of breath. 18 g 3  . amLODipine (NORVASC) 5 MG tablet Take 1 tablet (5 mg total) by mouth daily. 30 tablet 3  . cetirizine (ZYRTEC) 5 MG tablet Take 1 tablet (5 mg total) by mouth daily. (Patient taking differently: Take 5 mg by mouth daily as needed.) 30 tablet 0  . rosuvastatin (CRESTOR) 5 MG tablet Take 1 tablet (5 mg total) by mouth  daily. 30 tablet 0  . acetaminophen (TYLENOL) 325 MG tablet Take 650 mg by mouth as needed for mild pain.    . Blood Pressure Monitoring (BLOOD PRESSURE KIT) DEVI 1 kit by Does not apply route daily. 1 each 0  . fluticasone (FLONASE) 50 MCG/ACT nasal spray Place 1 spray into both nostrils daily. (Patient taking differently: Place 2 sprays into both nostrils daily as needed.) 16 g 0  . Vitamin D, Ergocalciferol, (DRISDOL) 1.25 MG (50000 UNIT) CAPS capsule Take 1 capsule (50,000 Units total) by mouth every 7 (seven) days. (Patient not taking: Reported on 11/27/2020) 16 capsule 0   No facility-administered medications prior to visit.    No Known Allergies  ROS Review of Systems  Constitutional: Positive for fatigue.  Eyes: Negative.   Respiratory: Negative.    Cardiovascular: Negative.   Neurological: Negative.   Psychiatric/Behavioral: Positive for dysphoric mood.      Objective:    Physical Exam HENT:     Head: Normocephalic and atraumatic.  Cardiovascular:     Rate and Rhythm: Normal rate and regular rhythm.     Pulses: Normal pulses.     Heart sounds: Normal heart sounds.  Pulmonary:     Effort: Pulmonary effort is normal.     Breath sounds: Normal breath sounds.  Abdominal:     General: Abdomen is flat. Bowel sounds are normal.     Palpations: Abdomen is soft.  Neurological:     General: No focal deficit present.     Mental Status: She is alert and oriented to person, place, and time. Mental status is at baseline.  Psychiatric:        Mood and Affect: Mood normal.        Behavior: Behavior normal.        Thought Content: Thought content normal.        Judgment: Judgment normal.     BP 131/77   Pulse 88   Ht 5' 4" (1.626 m)   Wt 117 lb 12.8 oz (53.4 kg)   SpO2 93%   BMI 20.22 kg/m  Wt Readings from Last 3 Encounters:  11/27/20 117 lb 12.8 oz (53.4 kg)  09/30/20 114 lb 9.6 oz (52 kg)  08/19/20 117 lb (53.1 kg)     Health Maintenance Due  Topic Date Due  . Hepatitis C Screening  Never done  . COVID-19 Vaccine (1) Never done  . HIV Screening  Never done  . TETANUS/TDAP  Never done  . COLONOSCOPY (Pts 45-49yrs Insurance coverage will need to be confirmed)  Never done  . PAP SMEAR-Modifier  10/14/2019  . MAMMOGRAM  12/15/2019    There are no preventive care reminders to display for this patient.  Lab Results  Component Value Date   TSH 1.120 06/25/2020   Lab Results  Component Value Date   WBC 8.4 06/25/2020   HGB 17.0 (H) 06/25/2020   HCT 49.0 (H) 06/25/2020   MCV 101 (H) 06/25/2020   PLT 320 06/25/2020   Lab Results  Component Value Date   NA 144 08/13/2020   K 4.8 08/13/2020   CO2 22 08/13/2020   GLUCOSE 107 (H) 08/13/2020   BUN 9 08/13/2020   CREATININE 0.88 08/13/2020   BILITOT 0.5  06/25/2020   ALKPHOS 147 (H) 06/25/2020   AST 51 (H) 06/25/2020   ALT 30 06/25/2020   PROT 7.6 06/25/2020   ALBUMIN 4.4 06/25/2020   CALCIUM 10.1 08/13/2020   ANIONGAP 7 06/28/2016   Lab Results    Component Value Date   CHOL 216 (H) 06/25/2020   Lab Results  Component Value Date   HDL 94 06/25/2020   Lab Results  Component Value Date   LDLCALC 106 (H) 06/25/2020   Lab Results  Component Value Date   TRIG 95 06/25/2020   Lab Results  Component Value Date   CHOLHDL 2.3 06/25/2020   Lab Results  Component Value Date   HGBA1C 5.0 06/25/2020      Assessment & Plan:    1. Essential hypertension - Her blood pressure is under control and will continue on current medication and DASH diet. - amLODipine (NORVASC) 5 MG tablet; Take 1 tablet (5 mg total) by mouth daily.  Dispense: 30 tablet; Refill: 3  2. Allergy, subsequent encounter - Her allergy is under control and she will continue on current medication. - cetirizine (ZYRTEC) 5 MG tablet; Take 1 tablet (5 mg total) by mouth daily.  Dispense: 30 tablet; Refill: 0  3. Elevated lipids - She will continue on current medication, low fat/cholesterol diet. - rosuvastatin (CRESTOR) 5 MG tablet; Take 1 tablet (5 mg total) by mouth daily.  Dispense: 30 tablet; Refill: 2  4. Vitamin D deficiency - Will recheck Vitamin D in future. - Vitamin D, Ergocalciferol, (DRISDOL) 1.25 MG (50000 UNIT) CAPS capsule; Take 1 capsule (50,000 Units total) by mouth every 7 (seven) days.  Dispense: 16 capsule; Refill: 0  5. Smoking - She was encouraged on smoking cessation, was provided with [place] Quitline information.  6. Dysphoric mood - She will follow up with [facility] Ms. Pruitt Heather. Advised to call the Crisis help line with worsening symptoms.    Follow-up: Return in about 13 weeks (around 02/26/2021), or if symptoms worsen or fail to improve.    Chioma E Iloabachie, NP 

## 2020-11-27 NOTE — Patient Instructions (Signed)

## 2020-12-04 ENCOUNTER — Telehealth: Payer: Self-pay | Admitting: Pharmacist

## 2020-12-04 NOTE — Telephone Encounter (Signed)
12/04/2020 10:13:50 AM - Advair refill online with [facility]  -- Rhetta Mura - Thursday, December 04, 2020 10:13 AM --Placed refill online with [facility], order# [id number]. Allow 7-10 business days to receive.

## 2020-12-15 ENCOUNTER — Telehealth: Payer: Self-pay | Admitting: Pharmacist

## 2020-12-15 NOTE — Telephone Encounter (Signed)
--   Alexis Parks - Monday, December 15, 2020 8:35 AM --Received notice from [employer] for refill on ProAir, checked and faxed back. 

## 2020-12-17 ENCOUNTER — Institutional Professional Consult (permissible substitution): Payer: Self-pay | Admitting: Licensed Clinical Social Worker

## 2020-12-17 ENCOUNTER — Other Ambulatory Visit: Payer: Self-pay

## 2020-12-17 ENCOUNTER — Telehealth: Payer: Self-pay | Admitting: Licensed Clinical Social Worker

## 2020-12-17 NOTE — Telephone Encounter (Signed)
Called the patient during today's scheduled appointment; a woman answered and stated she was not home and did not know when she would return.

## 2020-12-31 ENCOUNTER — Encounter: Payer: Self-pay | Admitting: *Deleted

## 2020-12-31 ENCOUNTER — Ambulatory Visit
Admission: RE | Admit: 2020-12-31 | Discharge: 2020-12-31 | Disposition: A | Discharge status: Home / Self Care | Payer: Self-pay | Source: Ambulatory Visit | Attending: Oncology | Admitting: Oncology

## 2020-12-31 ENCOUNTER — Other Ambulatory Visit: Payer: Self-pay | Admitting: Oncology

## 2020-12-31 ENCOUNTER — Ambulatory Visit: Payer: Self-pay | Attending: Oncology | Admitting: *Deleted

## 2020-12-31 ENCOUNTER — Other Ambulatory Visit: Payer: Self-pay

## 2020-12-31 VITALS — BP 126/85 | HR 108 | Temp 98.5°F | Ht 64.0 in | Wt 117.0 lb

## 2020-12-31 DIAGNOSIS — Z0000 Encounter for general adult medical examination without abnormal findings: Secondary | ICD-10-CM

## 2020-12-31 NOTE — Progress Notes (Signed)
  Subjective:     Patient ID: MARKEETA SCALF, female   DOB: [birth year], 60 y.o.   MRN: [id number]  HPI   [facility] Medical History Record - 12/31/20 1100      Breast History   Screening cycle New    CBE Date 12/31/20    Provider (CBE) [facility]; [facility]    Initial Mammogram 12/31/20    Last Mammogram Annual    Last Mammogram Date 12/14/17    Provider (Mammogram)  [facility]    Recent Breast Symptoms None      Breast Cancer History   Breast Cancer History No personal or family history      Previous History of Breast Problems   Breast Surgery or Biopsy None    Breast Implants --   bilateral implants   BSE Done Monthly      Gynecological/Obstetrical History   LMP --   2014   Is there any chance that the client could be pregnant?  No    Age at menarche 42    Age at menopause 43    PAP smear history Annually    Date of last PAP  10/13/16    Provider (PAP) [facility] neg/neg    Age at first live birth n/a    DES Exposure Unkown    Cervical, Uterine or Ovarian cancer No    Family history of Cervial, Uterine or Ovarian cancer No    Hysterectomy No    Cervix removed No    Ovaries removed No    Laser/Cryosurgery No    Current method of birth control None    Smoking history None             Review of Systems     Objective:   Physical Exam Chest:  Breasts:     Right: Mass present. No swelling, bleeding, inverted nipple, nipple discharge, skin change, tenderness, axillary adenopathy or supraclavicular adenopathy.     Left: No swelling, bleeding, inverted nipple, mass, nipple discharge, skin change, tenderness, axillary adenopathy or supraclavicular adenopathy.        Comments: Patient has bilateral breast implants Lymphadenopathy:     Upper Body:     Right upper body: No supraclavicular or axillary adenopathy.     Left upper body: No supraclavicular or axillary adenopathy.        Assessment:     60 year old female returns to [facility] for annual screening.  On clinical breast  exam I can palpate an approximate less than 0.5 cm nodule at 7:00 right breast.  This is the same area that was palpated in 2018 and is unchanged in size on today's exam.  She had negative imaging at that time, and was also seen by Dr. Evette Cristal who believed this to a benign process.  Taught self breast awareness.      Plan:     Screening mammogram ordered.  Will follow up per [facility] protocol.

## 2020-12-31 NOTE — Patient Instructions (Signed)
Gave patient hand-out, Women Staying Healthy, Active and Well from [facility], with education on breast health, pap smears, heart and colon health. 

## 2021-01-01 ENCOUNTER — Encounter: Payer: Self-pay | Admitting: *Deleted

## 2021-01-01 NOTE — Progress Notes (Signed)
Letter mailed from the [facility] to inform patient of her normal mammogram results.  Patient is to follow-up with annual screening in one year. 

## 2021-01-02 ENCOUNTER — Other Ambulatory Visit: Payer: Self-pay

## 2021-01-19 ENCOUNTER — Other Ambulatory Visit: Payer: Self-pay | Admitting: Gerontology

## 2021-01-19 ENCOUNTER — Other Ambulatory Visit: Payer: Self-pay

## 2021-01-19 DIAGNOSIS — Z8709 Personal history of other diseases of the respiratory system: Secondary | ICD-10-CM

## 2021-01-19 MED FILL — Fluticasone-Salmeterol Aer Powder BA 250-50 MCG/ACT: RESPIRATORY_TRACT | Qty: 180 | Fill #0 | Status: CN

## 2021-01-20 ENCOUNTER — Other Ambulatory Visit: Payer: Self-pay

## 2021-01-20 MED ORDER — FLUTICASONE-SALMETEROL 250-50 MCG/ACT IN AEPB
INHALATION_SPRAY | RESPIRATORY_TRACT | 1 refills | Status: DC
  Filled 2021-01-20: qty 180, 90d supply, fill #0
  Filled 2021-04-30 – 2021-06-03 (×2): qty 180, 90d supply, fill #1

## 2021-01-20 MED FILL — Albuterol Sulfate Inhal Aero 108 MCG/ACT (90MCG Base Equiv): RESPIRATORY_TRACT | 75 days supply | Qty: 25.5 | Fill #0 | Status: AC

## 2021-01-28 ENCOUNTER — Other Ambulatory Visit: Payer: Self-pay

## 2021-01-28 ENCOUNTER — Telehealth: Payer: Self-pay | Admitting: Internal Medicine

## 2021-01-28 DIAGNOSIS — T7840XD Allergy, unspecified, subsequent encounter: Secondary | ICD-10-CM

## 2021-01-28 DIAGNOSIS — J329 Chronic sinusitis, unspecified: Secondary | ICD-10-CM | POA: Insufficient documentation

## 2021-01-28 DIAGNOSIS — J019 Acute sinusitis, unspecified: Secondary | ICD-10-CM

## 2021-01-28 MED ORDER — FLUTICASONE PROPIONATE 50 MCG/ACT NA SUSP
NASAL | 0 refills | Status: DC
  Filled 2021-01-28: qty 16, 60d supply, fill #0

## 2021-01-28 MED ORDER — GUAIFENESIN-DM 100-10 MG/5ML PO SYRP
5.0000 mL | ORAL_SOLUTION | Freq: Four times a day (QID) | ORAL | 0 refills | Status: DC | PRN
  Filled 2021-01-28: qty 236, 12d supply, fill #0

## 2021-01-28 MED ORDER — SULFAMETHOXAZOLE-TRIMETHOPRIM 800-160 MG PO TABS
1.0000 | ORAL_TABLET | Freq: Two times a day (BID) | ORAL | 0 refills | Status: DC
  Filled 2021-01-28: qty 14, 7d supply, fill #0

## 2021-01-28 NOTE — Progress Notes (Signed)
Established Patient Office Visit  Subjective:  Patient ID: Alexis Parks, female    DOB: 1961-05-20  Age: 60 y.o. MRN: [id number]  CC:  Chief Complaint  Patient presents with  . Sinus Problem    Sinus infection, head & face pain. Chest pain, runny nose, and cough    HPI Alexis Parks presents for sinus infection symptoms.   Past Medical History:  Diagnosis Date  . Allergy    seasonal  . Asthma     Past Surgical History:  Procedure Laterality Date  . AUGMENTATION MAMMAPLASTY Bilateral 63+ yrs ago   silicone  . EYE SURGERY     cataract surgery    Family History  Problem Relation Age of Onset  . Bone cancer Father   . Diabetes Maternal Grandfather     Social History   Socioeconomic History  . Marital status: Divorced    Spouse name: Not on file  . Number of children: Not on file  . Years of education: Not on file  . Highest education level: Not on file  Occupational History  . Not on file  Tobacco Use  . Smoking status: Current Every Day Smoker    Packs/day: 1.00    Years: 42.00    Pack years: 42.00    Types: Cigarettes  . Smokeless tobacco: Never Used  . Tobacco comment: "i am thinking about it"  Substance and Sexual Activity  . Alcohol use: Yes    Alcohol/week: 1.0 - 2.0 standard drink    Types: 1 - 2 Cans of beer per week    Comment: every other day; 3-4 on weekends  . Drug use: No  . Sexual activity: Yes    Birth control/protection: Post-menopausal  Other Topics Concern  . Not on file  Social History Narrative  . Not on file   Social Determinants of Health   Financial Resource Strain: Not on file  Food Insecurity: Not on file  Transportation Needs: Not on file  Physical Activity: Not on file  Stress: Not on file  Social Connections: Not on file  Intimate Partner Violence: Not on file    Outpatient Medications Prior to Visit  Medication Sig Dispense Refill  . acetaminophen (TYLENOL) 325 MG tablet Take 650 mg by mouth as needed  for mild pain.    Marland Kitchen albuterol (VENTOLIN HFA) 108 (90 Base) MCG/ACT inhaler INHALE 2 PUFFS BY MOUTH EVERY 6 HOURS AS NEEDED FOR WHEEZING OR SHORT OF BREATH 25.5 g 3  . amLODipine (NORVASC) 5 MG tablet TAKE ONE TABLET BY MOUTH EVERY DAY 30 tablet 3  . Blood Pressure Monitoring (BLOOD PRESSURE KIT) DEVI 1 kit by Does not apply route daily. 1 each 0  . cetirizine (ZYRTEC) 10 MG tablet TAKE 1/2 TABLET BY MOUTH EVERY DAY 15 tablet 0  . cetirizine (ZYRTEC) 5 MG tablet Take 1 tablet (5 mg total) by mouth daily. 30 tablet 0  . fluticasone (FLONASE) 50 MCG/ACT nasal spray PLACE 1 SPRAY INTO BOTH NOSTRILS EVERY DAY (Patient taking differently: Place 2 sprays into both nostrils daily as needed.) 16 g 0  . fluticasone-salmeterol (ADVAIR DISKUS) 250-50 MCG/ACT AEPB INHALE 1 PUFF INTO THE LUNGS TWICE DAILY. 180 each 1  . rosuvastatin (CRESTOR) 5 MG tablet TAKE ONE TABLET BY MOUTH EVERY DAY 30 tablet 2  . Vitamin D, Ergocalciferol, (DRISDOL) 1.25 MG (50000 UNIT) CAPS capsule TAKE ONE CAPSULE BY MOUTH ONCE A WEEK 16 capsule 0   No facility-administered medications prior to visit.  No Known Allergies  ROS Review of Systems    Objective:    Physical Exam  There were no vitals taken for this visit. Wt Readings from Last 3 Encounters:  12/31/20 117 lb (53.1 kg)  11/27/20 117 lb 12.8 oz (53.4 kg)  09/30/20 114 lb 9.6 oz (52 kg)     Health Maintenance Due  Topic Date Due  . COVID-19 Vaccine (1) Never done  . HIV Screening  Never done  . Hepatitis C Screening  Never done  . TETANUS/TDAP  Never done  . COLONOSCOPY (Pts 45-50yr Insurance coverage will need to be confirmed)  Never done  . PAP SMEAR-Modifier  10/14/2019    There are no preventive care reminders to display for this patient.  Lab Results  Component Value Date   TSH 1.120 06/25/2020   Lab Results  Component Value Date   WBC 8.4 06/25/2020   HGB 17.0 (H) 06/25/2020   HCT 49.0 (H) 06/25/2020   MCV 101 (H) 06/25/2020   PLT 320  06/25/2020   Lab Results  Component Value Date   NA 144 08/13/2020   K 4.8 08/13/2020   CO2 22 08/13/2020   GLUCOSE 107 (H) 08/13/2020   BUN 9 08/13/2020   CREATININE 0.88 08/13/2020   BILITOT 0.5 06/25/2020   ALKPHOS 147 (H) 06/25/2020   AST 51 (H) 06/25/2020   ALT 30 06/25/2020   PROT 7.6 06/25/2020   ALBUMIN 4.4 06/25/2020   CALCIUM 10.1 08/13/2020   ANIONGAP 7 06/28/2016   Lab Results  Component Value Date   CHOL 216 (H) 06/25/2020   Lab Results  Component Value Date   HDL 94 06/25/2020   Lab Results  Component Value Date   LDLCALC 106 (H) 06/25/2020   Lab Results  Component Value Date   TRIG 95 06/25/2020   Lab Results  Component Value Date   CHOLHDL 2.3 06/25/2020   Lab Results  Component Value Date   HGBA1C 5.0 06/25/2020      Assessment & Plan:   Problem List Items Addressed This Visit      Respiratory   Sinus infection     Other   Allergies      No orders of the defined types were placed in this encounter. 1. Acute non-recurrent sinusitis, unspecified location Patient called in with symptoms of low grade fever, head congestion, non productive cough and hoarseness. History of asthma and seasonal allergies. Has responded in the past with antibiotics and decongestants. No known allergies to antibiotics. Plan to prescribe antibiotic and decongestant, Robbitusum DM, Flonase and  Bactrim. Ordered to come into clinic to be seen if no improvement.    Follow-up: No follow-ups on file.    Tyleigh Mahn SCharyl Bigger

## 2021-02-05 ENCOUNTER — Other Ambulatory Visit: Payer: Self-pay

## 2021-02-26 ENCOUNTER — Other Ambulatory Visit: Payer: Self-pay

## 2021-02-26 ENCOUNTER — Encounter: Payer: Self-pay | Admitting: Gerontology

## 2021-02-26 ENCOUNTER — Ambulatory Visit: Payer: Self-pay | Admitting: Gerontology

## 2021-02-26 VITALS — BP 118/69 | HR 96 | Temp 97.0°F | Resp 16 | Ht 64.0 in | Wt 113.2 lb

## 2021-02-26 DIAGNOSIS — R059 Cough, unspecified: Secondary | ICD-10-CM

## 2021-02-26 DIAGNOSIS — R062 Wheezing: Secondary | ICD-10-CM | POA: Insufficient documentation

## 2021-02-26 MED ORDER — BENZONATATE 100 MG PO CAPS
100.0000 mg | ORAL_CAPSULE | Freq: Three times a day (TID) | ORAL | 0 refills | Status: DC | PRN
  Filled 2021-02-26: qty 20, 7d supply, fill #0

## 2021-02-26 MED ORDER — PREDNISONE 10 MG PO TABS
10.0000 mg | ORAL_TABLET | Freq: Every day | ORAL | 0 refills | Status: DC
  Filled 2021-02-26: qty 5, 5d supply, fill #0

## 2021-02-26 NOTE — Progress Notes (Signed)
Established Patient Office Visit  Subjective:  Patient ID: Alexis Parks, female    DOB: Jan 16, 1961  Age: 60 y.o. MRN: [id number]  CC:  Chief Complaint  Patient presents with   Follow-up   Hypertension   Shortness of Breath    Symptoms x 1 week. Patient has taken OTC Tussin.   Cough    Sometimes productive   Fatigue    HPI Alexis Parks  is a 60 y/o female who has history of Allergy, Asthma, Hyperlipidemia, Hypertension,presents for c/o worsening lower respiratory symptoms that's associated with productive cough, with yellowish phlegm, shortness of breath, fatigue that has been going on for one week.  She was treated with Bactrim for 7 days due to Sinusitis and cough on 01/28/21. She states that she continues to cough , which wakes her up at night, and takes OTC Tussin with minimal relief. She c/o mild chest tightness, wheezing ," denies fever and chills. She has a history of Asthma,smokes 12 cigarettes daily and admits the desire to quit. She had 2 Covid vaccines, but has not had the booster. Overall, she states that she's concerned about her lingering cough and wheezing.   Past Medical History:  Diagnosis Date   Allergy    seasonal   Asthma    Hyperlipidemia    Hypertension     Past Surgical History:  Procedure Laterality Date   AUGMENTATION MAMMAPLASTY Bilateral 81+ yrs ago   silicone   EYE SURGERY     cataract surgery    Family History  Problem Relation Age of Onset   Hyperlipidemia Mother    Hypertension Mother    Thyroid disease Mother    Bone cancer Father    COPD Sister    COPD Sister    Restless legs syndrome Sister    CAD Maternal Grandmother    Diabetes Maternal Grandfather     Social History   Socioeconomic History   Marital status: Divorced    Spouse name: Not on file   Number of children: Not on file   Years of education: Not on file   Highest education level: Not on file  Occupational History   Not on file  Tobacco Use   Smoking  status: Every Day    Packs/day: 0.75    Years: 42.00    Pack years: 31.50    Types: Cigarettes   Smokeless tobacco: Never   Tobacco comments:    "i am thinking about it" decreased from 1 ppd to 3/4 ppd  Vaping Use   Vaping Use: Never used  Substance and Sexual Activity   Alcohol use: Yes    Alcohol/week: 1.0 - 2.0 standard drink    Types: 1 - 2 Cans of beer per week    Comment: every other day; 3-4 on weekends   Drug use: No   Sexual activity: Yes    Birth control/protection: Post-menopausal  Other Topics Concern   Not on file  Social History Narrative   Not on file   Social Determinants of Health   Financial Resource Strain: Not on file  Food Insecurity: No Food Insecurity   Worried About Running Out of Food in the Last Year: Never true   Ran Out of Food in the Last Year: Never true  Transportation Needs: No Transportation Needs   Lack of Transportation (Medical): No   Lack of Transportation (Non-Medical): No  Physical Activity: Not on file  Stress: Not on file  Social Connections: Not on file  Intimate Partner  Violence: Not on file    Outpatient Medications Prior to Visit  Medication Sig Dispense Refill   acetaminophen (TYLENOL) 325 MG tablet Take 650 mg by mouth as needed for mild pain.     albuterol (VENTOLIN HFA) 108 (90 Base) MCG/ACT inhaler INHALE 2 PUFFS BY MOUTH EVERY 6 HOURS AS NEEDED FOR WHEEZING OR SHORT OF BREATH 25.5 g 3   amLODipine (NORVASC) 5 MG tablet TAKE ONE TABLET BY MOUTH EVERY DAY 30 tablet 3   Blood Pressure Monitoring (BLOOD PRESSURE KIT) DEVI 1 kit by Does not apply route daily. 1 each 0   cetirizine (ZYRTEC) 5 MG tablet Take 1 tablet (5 mg total) by mouth daily. 30 tablet 0   fluticasone (FLONASE) 50 MCG/ACT nasal spray PLACE 1 SPRAY INTO BOTH NOSTRILS EVERY DAY 16 g 0   fluticasone-salmeterol (ADVAIR DISKUS) 250-50 MCG/ACT AEPB INHALE 1 PUFF INTO THE LUNGS TWICE DAILY. 180 each 1   rosuvastatin (CRESTOR) 5 MG tablet TAKE ONE TABLET BY MOUTH  EVERY DAY 30 tablet 2   Vitamin D, Ergocalciferol, (DRISDOL) 1.25 MG (50000 UNIT) CAPS capsule TAKE ONE CAPSULE BY MOUTH ONCE A WEEK 16 capsule 0   cetirizine (ZYRTEC) 10 MG tablet TAKE 1/2 TABLET BY MOUTH EVERY DAY 15 tablet 0   guaiFENesin-dextromethorphan (ROBITUSSIN DM) 100-10 MG/5ML syrup Take 5 mLs by mouth every 6 (six) hours as needed for cough and congestion. 240 mL 0   sulfamethoxazole-trimethoprim (BACTRIM DS) 800-160 MG tablet Take 1 tablet by mouth 2 (two) times daily. 14 tablet 0   No facility-administered medications prior to visit.    No Known Allergies  ROS Review of Systems  Constitutional:  Positive for fatigue.  HENT:  Negative for sinus pressure, sinus pain, sore throat and tinnitus.   Respiratory:  Positive for cough, chest tightness, shortness of breath and wheezing.   Cardiovascular: Negative.   Neurological: Negative.      Objective:    Physical Exam HENT:     Head: Normocephalic and atraumatic.  Cardiovascular:     Rate and Rhythm: Normal rate and regular rhythm.     Pulses: Normal pulses.     Heart sounds: Normal heart sounds.  Pulmonary:     Breath sounds: Examination of the left-upper field reveals wheezing and rales. Examination of the left-middle field reveals wheezing and rales. Wheezing and rales present.  Skin:    General: Skin is warm.  Neurological:     General: No focal deficit present.     Mental Status: She is alert and oriented to person, place, and time. Mental status is at baseline.  Psychiatric:        Mood and Affect: Mood normal.        Behavior: Behavior normal.        Thought Content: Thought content normal.        Judgment: Judgment normal.    BP 118/69 (BP Location: Right Arm, Patient Position: Sitting, Cuff Size: Normal)   Pulse 96   Temp (!) 97 F (36.1 C)   Resp 16   Ht '5\' 4"'  (1.626 m)   Wt 113 lb 3.2 oz (51.3 kg)   SpO2 93%   BMI 19.43 kg/m  Wt Readings from Last 3 Encounters:  02/26/21 113 lb 3.2 oz (51.3 kg)   12/31/20 117 lb (53.1 kg)  11/27/20 117 lb 12.8 oz (53.4 kg)     Health Maintenance Due  Topic Date Due   COVID-19 Vaccine (1) Never done   Pneumococcal Vaccine 0-64 Years  old (1 - PCV) Never done   HIV Screening  Never done   Hepatitis C Screening  Never done   TETANUS/TDAP  Never done   Zoster Vaccines- Shingrix (1 of 2) Never done   COLONOSCOPY (Pts 45-79yr Insurance coverage will need to be confirmed)  Never done   PAP SMEAR-Modifier  10/14/2019    There are no preventive care reminders to display for this patient.  Lab Results  Component Value Date   TSH 1.120 06/25/2020   Lab Results  Component Value Date   WBC 8.4 06/25/2020   HGB 17.0 (H) 06/25/2020   HCT 49.0 (H) 06/25/2020   MCV 101 (H) 06/25/2020   PLT 320 06/25/2020   Lab Results  Component Value Date   NA 144 08/13/2020   K 4.8 08/13/2020   CO2 22 08/13/2020   GLUCOSE 107 (H) 08/13/2020   BUN 9 08/13/2020   CREATININE 0.88 08/13/2020   BILITOT 0.5 06/25/2020   ALKPHOS 147 (H) 06/25/2020   AST 51 (H) 06/25/2020   ALT 30 06/25/2020   PROT 7.6 06/25/2020   ALBUMIN 4.4 06/25/2020   CALCIUM 10.1 08/13/2020   ANIONGAP 7 06/28/2016   Lab Results  Component Value Date   CHOL 216 (H) 06/25/2020   Lab Results  Component Value Date   HDL 94 06/25/2020   Lab Results  Component Value Date   LDLCALC 106 (H) 06/25/2020   Lab Results  Component Value Date   TRIG 95 06/25/2020   Lab Results  Component Value Date   CHOLHDL 2.3 06/25/2020   Lab Results  Component Value Date   HGBA1C 5.0 06/25/2020      Assessment & Plan:   1. Wheezing on left side of chest on exhalation -Due to chest tightness, she was started on Prednisone, educated on medication side effects and advised to notify clinic. She was advised to have chest X ray done, due to unilateral wheezing. - predniSONE (DELTASONE) 10 MG tablet; Take 1 tablet (10 mg total) by mouth daily with breakfast.  Dispense: 5 tablet; Refill: 0 - DG  Chest 2 View; Future - She was encouraged to get tested for Covid 19  2. Cough - She will continue on [employer] educated on medication side effects and advised to notify clinic. - benzonatate (TESSALON PERLES) 100 MG capsule; Take 1 capsule (100 mg total) by mouth 3 (three) times daily as needed for cough.  Dispense: 20 capsule; Refill: 0     Follow-up: Return in about 2 weeks (around 03/12/2021), or if symptoms worsen or fail to improve.    Cardale Dorer EJerold Coombe NP

## 2021-02-27 ENCOUNTER — Other Ambulatory Visit: Payer: Self-pay

## 2021-03-09 ENCOUNTER — Telehealth: Payer: Self-pay | Admitting: Pharmacist

## 2021-03-09 NOTE — Telephone Encounter (Signed)
03/09/2021 9:13:47 AM - Advair refill online with [facility]  -- Rhetta Mura - Monday, March 09, 2021 9:13 AM --Processed refill online with [facility], order# [id number].

## 2021-03-12 ENCOUNTER — Ambulatory Visit: Payer: Self-pay | Admitting: Gerontology

## 2021-03-12 ENCOUNTER — Ambulatory Visit: Payer: Self-pay

## 2021-03-18 ENCOUNTER — Other Ambulatory Visit: Payer: Self-pay

## 2021-03-19 ENCOUNTER — Other Ambulatory Visit: Payer: Self-pay

## 2021-03-19 ENCOUNTER — Ambulatory Visit: Payer: Self-pay | Admitting: Gerontology

## 2021-03-19 ENCOUNTER — Encounter: Payer: Self-pay | Admitting: Gerontology

## 2021-03-19 VITALS — BP 139/87 | HR 87 | Temp 97.3°F | Resp 16 | Ht 64.0 in | Wt 114.1 lb

## 2021-03-19 DIAGNOSIS — R252 Cramp and spasm: Secondary | ICD-10-CM

## 2021-03-19 DIAGNOSIS — E559 Vitamin D deficiency, unspecified: Secondary | ICD-10-CM

## 2021-03-19 DIAGNOSIS — Z8719 Personal history of other diseases of the digestive system: Secondary | ICD-10-CM

## 2021-03-19 DIAGNOSIS — I10 Essential (primary) hypertension: Secondary | ICD-10-CM

## 2021-03-19 MED ORDER — WITCH HAZEL-GLYCERIN EX PADS
1.0000 "application " | MEDICATED_PAD | CUTANEOUS | 0 refills | Status: DC | PRN
  Filled 2021-03-19: qty 40, fill #0

## 2021-03-19 NOTE — Progress Notes (Signed)
Established Patient Office Visit  Subjective:  Patient ID: Alexis Parks, female    DOB: [birth year]  Age: 60 y.o. MRN: [id number]  CC:  Chief Complaint  Patient presents with   Follow-up   Hypertension    Patient states BP at home is running 120s/70s   muscle cramps    Patient c/o muscle cramps x 1 week    HPI Alexis Parks is a 61 y/o female who has history of Seasonal allergy, Asthma, Hypertension, Hyperlipidemia, presents for c/o an episode of cramps to her bilateral arms that happened last week when she woke up from sleep. She states that she self discontinued her Crestor 3 days ago, and reports  that she has not experienced any episodes of cramps to bilateral arm. She also reports intermittent bright red blood from her hemorrhoid when she wipes after moving her bowel and admits to experiencing occasional constipation. She states that she is waiting for her [employer] application to be approved before scheduling her Colonoscopy appointment with Gastroenterology. She denies chest pain, palpitation, headache, dizziness and myalgia. Overall, she states that she's doing well and offers no further complaint.  Past Medical History:  Diagnosis Date   Allergy    seasonal   Asthma    Hyperlipidemia    Hypertension     Past Surgical History:  Procedure Laterality Date   AUGMENTATION MAMMAPLASTY Bilateral 74+ yrs ago   silicone   EYE SURGERY     cataract surgery    Family History  Problem Relation Age of Onset   Hyperlipidemia Mother    Hypertension Mother    Thyroid disease Mother    Bone cancer Father    COPD Sister    COPD Sister    Restless legs syndrome Sister    CAD Maternal Grandmother    Diabetes Maternal Grandfather     Social History   Socioeconomic History   Marital status: Divorced    Spouse name: Not on file   Number of children: Not on file   Years of education: Not on file   Highest education level: Not on file  Occupational History   Not  on file  Tobacco Use   Smoking status: Every Day    Packs/day: 0.75    Years: 42.00    Pack years: 31.50    Types: Cigarettes   Smokeless tobacco: Never   Tobacco comments:    "i am thinking about it" decreased from 1 ppd to 3/4 ppd  Vaping Use   Vaping Use: Never used  Substance and Sexual Activity   Alcohol use: Yes    Alcohol/week: 1.0 - 2.0 standard drink    Types: 1 - 2 Cans of beer per week    Comment: every other day; 3-4 on weekends   Drug use: No   Sexual activity: Yes    Birth control/protection: Post-menopausal  Other Topics Concern   Not on file  Social History Narrative   Not on file   Social Determinants of Health   Financial Resource Strain: Not on file  Food Insecurity: No Food Insecurity   Worried About Running Out of Food in the Last Year: Never true   Ran Out of Food in the Last Year: Never true  Transportation Needs: No Transportation Needs   Lack of Transportation (Medical): No   Lack of Transportation (Non-Medical): No  Physical Activity: Not on file  Stress: Not on file  Social Connections: Not on file  Intimate Partner Violence: Not on file  Outpatient Medications Prior to Visit  Medication Sig Dispense Refill   acetaminophen (TYLENOL) 325 MG tablet Take 650 mg by mouth as needed for mild pain.     albuterol (VENTOLIN HFA) 108 (90 Base) MCG/ACT inhaler INHALE 2 PUFFS BY MOUTH EVERY 6 HOURS AS NEEDED FOR WHEEZING OR SHORT OF BREATH 25.5 g 3   amLODipine (NORVASC) 5 MG tablet TAKE ONE TABLET BY MOUTH EVERY DAY 30 tablet 3   Blood Pressure Monitoring (BLOOD PRESSURE KIT) DEVI 1 kit by Does not apply route daily. 1 each 0   cetirizine (ZYRTEC) 5 MG tablet Take 1 tablet (5 mg total) by mouth daily. 30 tablet 0   fluticasone (FLONASE) 50 MCG/ACT nasal spray PLACE 1 SPRAY INTO BOTH NOSTRILS EVERY DAY 16 g 0   fluticasone-salmeterol (ADVAIR DISKUS) 250-50 MCG/ACT AEPB INHALE 1 PUFF INTO THE LUNGS TWICE DAILY. 180 each 1   Vitamin D, Ergocalciferol,  (DRISDOL) 1.25 MG (50000 UNIT) CAPS capsule TAKE ONE CAPSULE BY MOUTH ONCE A WEEK 16 capsule 0   rosuvastatin (CRESTOR) 5 MG tablet TAKE ONE TABLET BY MOUTH EVERY DAY (Patient not taking: Reported on 03/19/2021) 30 tablet 2   benzonatate (TESSALON PERLES) 100 MG capsule Take 1 capsule (100 mg total) by mouth 3 (three) times daily as needed for cough. 20 capsule 0   predniSONE (DELTASONE) 10 MG tablet Take 1 tablet (10 mg total) by mouth daily with breakfast. 5 tablet 0   No facility-administered medications prior to visit.    No Known Allergies  ROS Review of Systems  Constitutional: Negative.   Eyes: Negative.   Respiratory: Negative.    Cardiovascular: Negative.   Musculoskeletal:  Positive for myalgias.  Neurological: Negative.   Psychiatric/Behavioral: Negative.       Objective:    Physical Exam HENT:     Head: Normocephalic and atraumatic.     Mouth/Throat:     Mouth: Mucous membranes are moist.  Eyes:     Extraocular Movements: Extraocular movements intact.     Conjunctiva/sclera: Conjunctivae normal.     Pupils: Pupils are equal, round, and reactive to light.  Cardiovascular:     Rate and Rhythm: Normal rate and regular rhythm.     Pulses: Normal pulses.     Heart sounds: Normal heart sounds.  Pulmonary:     Effort: Pulmonary effort is normal.     Breath sounds: Normal breath sounds.  Skin:    General: Skin is warm.  Neurological:     General: No focal deficit present.     Mental Status: She is alert and oriented to person, place, and time. Mental status is at baseline.  Psychiatric:        Mood and Affect: Mood normal.        Behavior: Behavior normal.        Thought Content: Thought content normal.        Judgment: Judgment normal.    BP 139/87 (BP Location: Left Arm, Patient Position: Sitting, Cuff Size: Normal)   Pulse 87   Temp (!) 97.3 F (36.3 C)   Resp 16   Ht $R'5\' 4"'Nj$  (1.626 m)   Wt 114 lb 1.6 oz (51.8 kg)   SpO2 94%   BMI 19.59 kg/m  Wt  Readings from Last 3 Encounters:  03/19/21 114 lb 1.6 oz (51.8 kg)  02/26/21 113 lb 3.2 oz (51.3 kg)  12/31/20 117 lb (53.1 kg)     Health Maintenance Due  Topic Date Due   COVID-19 Vaccine (  1) Never done   Pneumococcal Vaccine 49-65 Years old (1 - PCV) Never done   HIV Screening  Never done   Hepatitis C Screening  Never done   TETANUS/TDAP  Never done   Zoster Vaccines- Shingrix (1 of 2) Never done   COLONOSCOPY (Pts 45-71yr Insurance coverage will need to be confirmed)  Never done   PAP SMEAR-Modifier  10/14/2019    There are no preventive care reminders to display for this patient.  Lab Results  Component Value Date   TSH 1.120 06/25/2020   Lab Results  Component Value Date   WBC 8.4 06/25/2020   HGB 17.0 (H) 06/25/2020   HCT 49.0 (H) 06/25/2020   MCV 101 (H) 06/25/2020   PLT 320 06/25/2020   Lab Results  Component Value Date   NA 143 03/19/2021   K 4.9 03/19/2021   CO2 25 03/19/2021   GLUCOSE 94 03/19/2021   BUN 10 03/19/2021   CREATININE 0.69 03/19/2021   BILITOT <0.2 03/19/2021   ALKPHOS 117 03/19/2021   AST 29 03/19/2021   ALT 21 03/19/2021   PROT 7.3 03/19/2021   ALBUMIN 4.6 03/19/2021   CALCIUM 9.8 03/19/2021   ANIONGAP 7 06/28/2016   EGFR 101 03/19/2021   Lab Results  Component Value Date   CHOL 216 (H) 06/25/2020   Lab Results  Component Value Date   HDL 94 06/25/2020   Lab Results  Component Value Date   LDLCALC 106 (H) 06/25/2020   Lab Results  Component Value Date   TRIG 95 06/25/2020   Lab Results  Component Value Date   CHOLHDL 2.3 06/25/2020   Lab Results  Component Value Date   HGBA1C 5.0 06/25/2020      Assessment & Plan:     1. Muscle cramps -Will check CK and Chemistry to rule out side effect due to Statin - Comp Met (CMET); Future - CK (Creatine Kinase); Future - CK (Creatine Kinase) - Comp Met (CMET)  2. Vitamin D deficiency -Will check Vit D to rule in fatigue - Vitamin D (25 hydroxy); Future - Vitamin  D (25 hydroxy)  3. Essential hypertension -Her blood pressure was less than 140/90, she will continue on current medication, DASH diet and exercise as tolerated.  4. History of hemorrhoids - She will continue on current medication, was advised to increase fiber and water intake to help relieve constipation. She was advised to go to the ED with hematochezia and rectal bleed. - witch hazel-glycerin (TUCKS) pad; Apply 1 application topically as needed for itching.  Dispense: 40 each; Refill: 0    Follow-up: Return in about 4 weeks (around 04/16/2021), or if symptoms worsen or fail to improve.    Mosiah Bastin EJerold Coombe NP

## 2021-03-20 ENCOUNTER — Other Ambulatory Visit: Payer: Self-pay

## 2021-03-20 LAB — COMPREHENSIVE METABOLIC PANEL
ALT: 21 IU/L (ref 0–32)
AST: 29 IU/L (ref 0–40)
Albumin/Globulin Ratio: 1.7 (ref 1.2–2.2)
Albumin: 4.6 g/dL (ref 3.8–4.9)
Alkaline Phosphatase: 117 IU/L (ref 44–121)
BUN/Creatinine Ratio: 14 (ref 9–23)
BUN: 10 mg/dL (ref 6–24)
Bilirubin Total: <0.2 mg/dL (ref 0.0–1.2)
CO2: 25 mmol/L (ref 20–29)
Calcium: 9.8 mg/dL (ref 8.7–10.2)
Chloride: 101 mmol/L (ref 96–106)
Creatinine, Ser: 0.69 mg/dL (ref 0.57–1.00)
Globulin, Total: 2.7 g/dL (ref 1.5–4.5)
Glucose: 94 mg/dL (ref 65–99)
Potassium: 4.9 mmol/L (ref 3.5–5.2)
Sodium: 143 mmol/L (ref 134–144)
Total Protein: 7.3 g/dL (ref 6.0–8.5)
eGFR: 101 mL/min/{1.73_m2} (ref >59)

## 2021-03-20 LAB — CK: Total CK: 72 U/L (ref 32–182)

## 2021-03-20 LAB — VITAMIN D 25 HYDROXY (VIT D DEFICIENCY, FRACTURES): Vit D, 25-Hydroxy: 35.3 ng/mL (ref 30.0–100.0)

## 2021-03-26 ENCOUNTER — Telehealth: Payer: Self-pay | Admitting: Emergency Medicine

## 2021-03-26 NOTE — Telephone Encounter (Signed)
Patient called in today requesting an antibiotic for a productive cough (green) x 2 days. Patient denies any fever or other symptoms. Spoke with Lanora Manis, NP and she advised patient to get a covid test and if not feeling better next week to schedule an appointment. Lanora Manis, NP denied request for antibiotics. I advised patient of above and she agreed and voiced understanding.

## 2021-03-27 ENCOUNTER — Other Ambulatory Visit: Payer: Self-pay

## 2021-03-30 ENCOUNTER — Other Ambulatory Visit: Payer: Self-pay

## 2021-04-03 ENCOUNTER — Other Ambulatory Visit: Payer: Self-pay

## 2021-04-07 ENCOUNTER — Telehealth: Payer: Self-pay | Admitting: Pharmacist

## 2021-04-07 NOTE — Telephone Encounter (Signed)
04/07/2021 9:19:58 AM - ProAir refill called to [facility]  -- Rhetta Mura - Tuesday, April 07, 2021 9:18 AM --Maretta Los to place refill for ProAir-spoke with Ree Kida, he has placed refill allow 5-7 business days for [place] to receive.   This is patient's last refill--enrollment ends 07/15/21. Will need to renew for next fill.

## 2021-04-10 ENCOUNTER — Other Ambulatory Visit: Payer: Self-pay

## 2021-04-14 ENCOUNTER — Other Ambulatory Visit: Payer: Self-pay

## 2021-04-14 ENCOUNTER — Encounter: Payer: Self-pay | Admitting: Gerontology

## 2021-04-14 ENCOUNTER — Ambulatory Visit: Payer: Self-pay | Admitting: Gerontology

## 2021-04-14 VITALS — BP 119/78 | HR 93 | Temp 97.5°F | Resp 16 | Ht 64.0 in | Wt 116.4 lb

## 2021-04-14 DIAGNOSIS — R21 Rash and other nonspecific skin eruption: Secondary | ICD-10-CM | POA: Insufficient documentation

## 2021-04-14 MED ORDER — TRIAMCINOLONE ACETONIDE 0.1 % EX CREA
1.0000 "application " | TOPICAL_CREAM | Freq: Two times a day (BID) | CUTANEOUS | 0 refills | Status: DC
  Filled 2021-04-14 – 2021-04-15 (×2): qty 30, 15d supply, fill #0

## 2021-04-14 NOTE — Progress Notes (Signed)
Established Patient Office Visit  Subjective:  Patient ID: Alexis Parks, female    DOB: [birth year]  Age: 60 y.o. MRN: [id number]  CC:  Chief Complaint  Patient presents with   Rash    Patient in today c/o bilateral upper arms x 5 days. Patient has tried OTC Calamine lotion and Benadryl. Patient states her work has asked her to come in.    HPI Alexis Parks is a 60 y/o female who has history of Seasonal allergy, Asthma, Hypertension, Hyperlipidemia, presents for c/o pin point  pruritic clustered erythematous rash to bilateral upper arm and shoulders that started 5 days ago. She denies contacts with any irritant, didn't change body cream, laundry soap or contact with poison oaks. She denies fever and chills. She states that she applied Calamine lotion  to rash with minimal effects. Overall, she states that she doing well, but for the rash on her bilateral upper arm and shoulders.  Past Medical History:  Diagnosis Date   Allergy    seasonal   Asthma    Hyperlipidemia    Hypertension     Past Surgical History:  Procedure Laterality Date   AUGMENTATION MAMMAPLASTY Bilateral 43+ yrs ago   silicone   EYE SURGERY     cataract surgery    Family History  Problem Relation Age of Onset   Hyperlipidemia Mother    Hypertension Mother    Thyroid disease Mother    Bone cancer Father    COPD Sister    COPD Sister    Restless legs syndrome Sister    CAD Maternal Grandmother    Diabetes Maternal Grandfather     Social History   Socioeconomic History   Marital status: Divorced    Spouse name: Not on file   Number of children: Not on file   Years of education: Not on file   Highest education level: Not on file  Occupational History   Not on file  Tobacco Use   Smoking status: Every Day    Packs/day: 0.75    Years: 42.00    Pack years: 31.50    Types: Cigarettes   Smokeless tobacco: Never   Tobacco comments:    "i am thinking about it" decreased from 1 ppd to 3/4  ppd  Vaping Use   Vaping Use: Never used  Substance and Sexual Activity   Alcohol use: Yes    Alcohol/week: 1.0 - 2.0 standard drink    Types: 1 - 2 Cans of beer per week    Comment: every other day; 3-4 on weekends   Drug use: No   Sexual activity: Yes    Birth control/protection: Post-menopausal  Other Topics Concern   Not on file  Social History Narrative   Not on file   Social Determinants of Health   Financial Resource Strain: Not on file  Food Insecurity: No Food Insecurity   Worried About Running Out of Food in the Last Year: Never true   Ran Out of Food in the Last Year: Never true  Transportation Needs: No Transportation Needs   Lack of Transportation (Medical): No   Lack of Transportation (Non-Medical): No  Physical Activity: Not on file  Stress: Not on file  Social Connections: Not on file  Intimate Partner Violence: Not on file    Outpatient Medications Prior to Visit  Medication Sig Dispense Refill   acetaminophen (TYLENOL) 325 MG tablet Take 650 mg by mouth as needed for mild pain.     albuterol (  VENTOLIN HFA) 108 (90 Base) MCG/ACT inhaler INHALE 2 PUFFS BY MOUTH EVERY 6 HOURS AS NEEDED FOR WHEEZING OR SHORT OF BREATH 25.5 g 3   amLODipine (NORVASC) 5 MG tablet TAKE ONE TABLET BY MOUTH EVERY DAY 30 tablet 3   Blood Pressure Monitoring (BLOOD PRESSURE KIT) DEVI 1 kit by Does not apply route daily. 1 each 0   cetirizine (ZYRTEC) 5 MG tablet Take 1 tablet (5 mg total) by mouth daily. 30 tablet 0   fluticasone (FLONASE) 50 MCG/ACT nasal spray PLACE 1 SPRAY INTO BOTH NOSTRILS EVERY DAY 16 g 0   fluticasone-salmeterol (ADVAIR DISKUS) 250-50 MCG/ACT AEPB INHALE 1 PUFF INTO THE LUNGS TWICE DAILY. 180 each 1   rosuvastatin (CRESTOR) 5 MG tablet TAKE ONE TABLET BY MOUTH EVERY DAY 30 tablet 2   witch hazel-glycerin (TUCKS) pad Apply 1 application topically as needed for itching. 40 each 0   Vitamin D, Ergocalciferol, (DRISDOL) 1.25 MG (50000 UNIT) CAPS capsule TAKE ONE  CAPSULE BY MOUTH ONCE A WEEK (Patient not taking: Reported on 04/14/2021) 16 capsule 0   No facility-administered medications prior to visit.    No Known Allergies  ROS Review of Systems  Constitutional: Negative.   Respiratory: Negative.    Cardiovascular: Negative.   Skin:  Positive for rash (erythematous scattered rash to bilateral upper arm and shoulder).  Neurological: Negative.      Objective:    Physical Exam HENT:     Head: Normocephalic and atraumatic.  Cardiovascular:     Rate and Rhythm: Normal rate and regular rhythm.     Pulses: Normal pulses.     Heart sounds: Normal heart sounds.  Pulmonary:     Effort: Pulmonary effort is normal.     Breath sounds: Normal breath sounds.  Skin:    Findings: Rash present.       Neurological:     Mental Status: She is alert.    BP 119/78 (BP Location: Left Arm, Patient Position: Sitting, Cuff Size: Normal)   Pulse 93   Temp (!) 97.5 F (36.4 C)   Resp 16   Ht '5\' 4"'  (1.626 m)   Wt 116 lb 6.4 oz (52.8 kg)   SpO2 94%   BMI 19.98 kg/m  Wt Readings from Last 3 Encounters:  04/14/21 116 lb 6.4 oz (52.8 kg)  03/19/21 114 lb 1.6 oz (51.8 kg)  02/26/21 113 lb 3.2 oz (51.3 kg)     Health Maintenance Due  Topic Date Due   COVID-19 Vaccine (1) Never done   Pneumococcal Vaccine 54-67 Years old (1 - PCV) Never done   HIV Screening  Never done   Hepatitis C Screening  Never done   TETANUS/TDAP  Never done   Zoster Vaccines- Shingrix (1 of 2) Never done   COLONOSCOPY (Pts 45-104yr Insurance coverage will need to be confirmed)  Never done   PAP SMEAR-Modifier  10/14/2019    There are no preventive care reminders to display for this patient.  Lab Results  Component Value Date   TSH 1.120 06/25/2020   Lab Results  Component Value Date   WBC 8.4 06/25/2020   HGB 17.0 (H) 06/25/2020   HCT 49.0 (H) 06/25/2020   MCV 101 (H) 06/25/2020   PLT 320 06/25/2020   Lab Results  Component Value Date   NA 143 03/19/2021   K  4.9 03/19/2021   CO2 25 03/19/2021   GLUCOSE 94 03/19/2021   BUN 10 03/19/2021   CREATININE 0.69 03/19/2021  BILITOT <0.2 03/19/2021   ALKPHOS 117 03/19/2021   AST 29 03/19/2021   ALT 21 03/19/2021   PROT 7.3 03/19/2021   ALBUMIN 4.6 03/19/2021   CALCIUM 9.8 03/19/2021   ANIONGAP 7 06/28/2016   EGFR 101 03/19/2021   Lab Results  Component Value Date   CHOL 216 (H) 06/25/2020   Lab Results  Component Value Date   HDL 94 06/25/2020   Lab Results  Component Value Date   LDLCALC 106 (H) 06/25/2020   Lab Results  Component Value Date   TRIG 95 06/25/2020   Lab Results  Component Value Date   CHOLHDL 2.3 06/25/2020   Lab Results  Component Value Date   HGBA1C 5.0 06/25/2020      Assessment & Plan:   1. Rash - Ddx contact dermatitis,  she will continue on Triamcinolone, educated on medication side effects and advised to notify clinic - triamcinolone cream (KENALOG) 0.1 %; Apply 1 application topically 2 (two) times daily.  Dispense: 30 g; Refill: 0     Follow-up: Return in about 2 days (around 04/16/2021), or if symptoms worsen or fail to improve.    Calin Ellery Jerold Coombe, NP

## 2021-04-15 ENCOUNTER — Other Ambulatory Visit: Payer: Self-pay | Admitting: Internal Medicine

## 2021-04-15 ENCOUNTER — Other Ambulatory Visit: Payer: Self-pay

## 2021-04-15 ENCOUNTER — Other Ambulatory Visit: Payer: Self-pay | Admitting: Gerontology

## 2021-04-15 ENCOUNTER — Telehealth: Payer: Self-pay | Admitting: Pharmacist

## 2021-04-15 DIAGNOSIS — E785 Hyperlipidemia, unspecified: Secondary | ICD-10-CM

## 2021-04-15 DIAGNOSIS — J019 Acute sinusitis, unspecified: Secondary | ICD-10-CM

## 2021-04-15 MED ORDER — ROSUVASTATIN CALCIUM 5 MG PO TABS
ORAL_TABLET | Freq: Every day | ORAL | 2 refills | Status: DC
  Filled 2021-04-15: qty 30, 30d supply, fill #0

## 2021-04-15 MED ORDER — FLUTICASONE PROPIONATE 50 MCG/ACT NA SUSP
NASAL | 2 refills | Status: DC
  Filled 2021-04-15 – 2022-03-15 (×2): qty 16, 60d supply, fill #0

## 2021-04-15 MED FILL — Amlodipine Besylate Tab 5 MG (Base Equivalent): ORAL | 30 days supply | Qty: 30 | Fill #0 | Status: AC

## 2021-04-15 NOTE — Telephone Encounter (Signed)
Patient approved for medication assistance at [facility] until 01/18/22, as long as eligibility criteria continues to be met. Debra Cheek Administrative Assistant [facility] 

## 2021-04-16 ENCOUNTER — Other Ambulatory Visit: Payer: Self-pay

## 2021-04-16 ENCOUNTER — Ambulatory Visit: Payer: Self-pay | Admitting: Gerontology

## 2021-04-23 ENCOUNTER — Other Ambulatory Visit: Payer: Self-pay | Admitting: Gerontology

## 2021-04-23 ENCOUNTER — Other Ambulatory Visit: Payer: Self-pay

## 2021-04-23 DIAGNOSIS — Z8709 Personal history of other diseases of the respiratory system: Secondary | ICD-10-CM

## 2021-04-23 MED FILL — Albuterol Sulfate Inhal Aero 108 MCG/ACT (90MCG Base Equiv): RESPIRATORY_TRACT | 75 days supply | Qty: 25.5 | Fill #0 | Status: CN

## 2021-04-24 ENCOUNTER — Other Ambulatory Visit: Payer: Self-pay

## 2021-04-28 ENCOUNTER — Other Ambulatory Visit: Payer: Self-pay

## 2021-04-30 ENCOUNTER — Other Ambulatory Visit: Payer: Self-pay

## 2021-04-30 ENCOUNTER — Telehealth: Payer: Self-pay | Admitting: Emergency Medicine

## 2021-04-30 NOTE — Telephone Encounter (Signed)
Patient called in today stating that she tested positive for covid on 04/29/21. Patient wanted to know how to treat. Advised patient to treat her symptoms, if worsening sob go to ED for evaluation. Patient states she had diarrhea and headache yesterday and today she is weak. Advised to increase fluids and rest. Patient agreed and voiced understanding.

## 2021-06-03 ENCOUNTER — Other Ambulatory Visit: Payer: Self-pay

## 2021-06-03 MED FILL — Albuterol Sulfate Inhal Aero 108 MCG/ACT (90MCG Base Equiv): RESPIRATORY_TRACT | 75 days supply | Qty: 25.5 | Fill #0 | Status: CN

## 2021-06-04 ENCOUNTER — Other Ambulatory Visit: Payer: Self-pay

## 2021-06-29 ENCOUNTER — Encounter: Payer: Self-pay | Admitting: General Surgery

## 2021-07-23 ENCOUNTER — Other Ambulatory Visit: Payer: Self-pay

## 2021-12-01 ENCOUNTER — Ambulatory Visit: Payer: Self-pay | Admitting: Gerontology

## 2021-12-09 ENCOUNTER — Other Ambulatory Visit: Payer: Self-pay

## 2021-12-09 ENCOUNTER — Ambulatory Visit: Payer: Self-pay | Admitting: Gerontology

## 2021-12-09 ENCOUNTER — Encounter: Payer: Self-pay | Admitting: Gerontology

## 2021-12-09 VITALS — BP 131/83 | HR 101 | Temp 98.1°F | Resp 16 | Ht 63.0 in | Wt 114.3 lb

## 2021-12-09 DIAGNOSIS — T7840XD Allergy, unspecified, subsequent encounter: Secondary | ICD-10-CM

## 2021-12-09 DIAGNOSIS — I10 Essential (primary) hypertension: Secondary | ICD-10-CM

## 2021-12-09 DIAGNOSIS — E785 Hyperlipidemia, unspecified: Secondary | ICD-10-CM

## 2021-12-09 DIAGNOSIS — Z0000 Encounter for general adult medical examination without abnormal findings: Secondary | ICD-10-CM

## 2021-12-09 DIAGNOSIS — Z8709 Personal history of other diseases of the respiratory system: Secondary | ICD-10-CM

## 2021-12-09 MED ORDER — FLUTICASONE FUROATE-VILANTEROL 200-25 MCG/ACT IN AEPB
1.0000 | INHALATION_SPRAY | Freq: Every day | RESPIRATORY_TRACT | 3 refills | Status: DC
  Filled 2021-12-09: qty 60, 60d supply, fill #0

## 2021-12-09 MED ORDER — ALBUTEROL SULFATE HFA 108 (90 BASE) MCG/ACT IN AERS
2.0000 | INHALATION_SPRAY | Freq: Four times a day (QID) | RESPIRATORY_TRACT | 3 refills | Status: DC | PRN
  Filled 2021-12-09: qty 6.7, 25d supply, fill #0

## 2021-12-09 MED ORDER — PREDNISONE 10 MG PO TABS
10.0000 mg | ORAL_TABLET | Freq: Every day | ORAL | 0 refills | Status: DC
  Filled 2021-12-09: qty 5, 5d supply, fill #0

## 2021-12-09 MED ORDER — AMLODIPINE BESYLATE 5 MG PO TABS
5.0000 mg | ORAL_TABLET | Freq: Every day | ORAL | 3 refills | Status: DC
  Filled 2021-12-09 – 2022-07-02 (×3): qty 30, 30d supply, fill #0

## 2021-12-09 MED ORDER — CETIRIZINE HCL 10 MG PO TABS
5.0000 mg | ORAL_TABLET | Freq: Every day | ORAL | 3 refills | Status: DC
  Filled 2021-12-09 – 2022-07-02 (×3): qty 15, 30d supply, fill #0

## 2021-12-09 MED ORDER — ROSUVASTATIN CALCIUM 5 MG PO TABS
5.0000 mg | ORAL_TABLET | Freq: Every day | ORAL | 2 refills | Status: DC
  Filled 2021-12-09 – 2022-07-02 (×3): qty 30, 30d supply, fill #0

## 2021-12-09 NOTE — Progress Notes (Signed)
? ?Established Patient Office Visit ? ?Subjective:  ?Patient ID: Alexis Parks, female    DOB: [birth year]  Age: 61 y.o. MRN: [id number] ? ?CC:  ?Chief Complaint  ?Patient presents with  ? Follow-up  ? Hypertension  ? ? ?HPI ?Alexis Parks is a 61 y/o female who has history of Seasonal allergy, Asthma, Hypertension, Hyperlipidemia, presents for routine follow up and medication refill. She admits to experiencing shortness of breath with exertion, wheezing, productive cough with minimal amount of lime colored phelgm, but denies chest tightness.  She continues to smoke 3/4 pack of cigarette daily and admits the desire to quit. She has been out of her medications for many months. Overall, she states that she's doing well and offers no further complaint. ? ?Past Medical History:  ?Diagnosis Date  ? Allergy   ? seasonal  ? Asthma   ? Hyperlipidemia   ? Hypertension   ? ? ?Past Surgical History:  ?Procedure Laterality Date  ? AUGMENTATION MAMMAPLASTY Bilateral 25+ yrs ago  ? silicone  ? EYE SURGERY    ? cataract surgery  ? ? ?Family History  ?Problem Relation Age of Onset  ? Hyperlipidemia Mother   ? Hypertension Mother   ? Thyroid disease Mother   ? Bone cancer Father   ? COPD Sister   ? COPD Sister   ? Restless legs syndrome Sister   ? CAD Maternal Grandmother   ? Diabetes Maternal Grandfather   ? ? ?Social History  ? ?Socioeconomic History  ? Marital status: Divorced  ?  Spouse name: Not on file  ? Number of children: Not on file  ? Years of education: Not on file  ? Highest education level: Not on file  ?Occupational History  ? Not on file  ?Tobacco Use  ? Smoking status: Every Day  ?  Packs/day: 0.75  ?  Years: 42.00  ?  Pack years: 31.50  ?  Types: Cigarettes  ? Smokeless tobacco: Never  ? Tobacco comments:  ?  "i am thinking about it" decreased from 1 ppd to 3/4 ppd  ?Vaping Use  ? Vaping Use: Never used  ?Substance and Sexual Activity  ? Alcohol use: Yes  ?  Alcohol/week: 1.0 - 2.0 standard drink  ?  Types: 1  - 2 Cans of beer per week  ?  Comment: every other day; 3-4 on weekends  ? Drug use: No  ? Sexual activity: Yes  ?  Birth control/protection: Post-menopausal  ?Other Topics Concern  ? Not on file  ?Social History Narrative  ? Not on file  ? ?Social Determinants of Health  ? ?Financial Resource Strain: Not on file  ?Food Insecurity: No Food Insecurity  ? Worried About Charity fundraiser in the Last Year: Never true  ? Ran Out of Food in the Last Year: Never true  ?Transportation Needs: No Transportation Needs  ? Lack of Transportation (Medical): No  ? Lack of Transportation (Non-Medical): No  ?Physical Activity: Not on file  ?Stress: Not on file  ?Social Connections: Not on file  ?Intimate Partner Violence: Not on file  ? ? ?Outpatient Medications Prior to Visit  ?Medication Sig Dispense Refill  ? acetaminophen (TYLENOL) 325 MG tablet Take 650 mg by mouth as needed for mild pain.    ? Blood Pressure Monitoring (BLOOD PRESSURE KIT) DEVI 1 kit by Does not apply route daily. 1 each 0  ? fluticasone (FLONASE) 50 MCG/ACT nasal spray PLACE 1 SPRAY INTO BOTH NOSTRILS EVERY DAY  16 g 2  ? witch hazel-glycerin (TUCKS) pad Apply 1 application topically as needed for itching. 40 each 0  ? albuterol (PROAIR HFA) 108 (90 Base) MCG/ACT inhaler INHALE 2 PUFFS BY MOUTH EVERY 6 HOURS AS NEEDED FOR WHEEZING OR SHORT OF BREATH 25.5 g 3  ? fluticasone-salmeterol (ADVAIR DISKUS) 250-50 MCG/ACT AEPB INHALE 1 PUFF INTO THE LUNGS TWICE DAILY. 180 each 1  ? amLODipine (NORVASC) 5 MG tablet TAKE ONE TABLET BY MOUTH EVERY DAY (Patient not taking: Reported on 12/09/2021) 30 tablet 3  ? cetirizine (ZYRTEC) 5 MG tablet Take 1 tablet (5 mg total) by mouth daily. (Patient not taking: Reported on 12/09/2021) 30 tablet 0  ? rosuvastatin (CRESTOR) 5 MG tablet TAKE ONE TABLET BY MOUTH EVERY DAY (Patient not taking: Reported on 12/09/2021) 30 tablet 2  ? triamcinolone cream (KENALOG) 0.1 % Apply 1 application topically 2 (two) times daily. 30 g 0  ? ?No  facility-administered medications prior to visit.  ? ? ?No Known Allergies ? ?ROS ?Review of Systems  ?Constitutional: Negative.   ?Eyes: Negative.   ?Respiratory:  Positive for cough, shortness of breath and wheezing.   ?Cardiovascular: Negative.   ?Neurological: Negative.   ?Psychiatric/Behavioral: Negative.    ? ?  ?Objective:  ?  ?Physical Exam ?HENT:  ?   Head: Normocephalic and atraumatic.  ?   Mouth/Throat:  ?   Mouth: Mucous membranes are moist.  ?Eyes:  ?   Extraocular Movements: Extraocular movements intact.  ?   Conjunctiva/sclera: Conjunctivae normal.  ?   Pupils: Pupils are equal, round, and reactive to light.  ?Cardiovascular:  ?   Rate and Rhythm: Normal rate and regular rhythm.  ?   Pulses: Normal pulses.  ?   Heart sounds: Normal heart sounds.  ?Pulmonary:  ?   Breath sounds: Examination of the right-upper field reveals wheezing. Examination of the left-upper field reveals wheezing. Examination of the right-middle field reveals wheezing. Examination of the left-middle field reveals wheezing. Examination of the right-lower field reveals wheezing. Examination of the left-lower field reveals wheezing. Wheezing present.  ?Neurological:  ?   General: No focal deficit present.  ?   Mental Status: She is alert and oriented to person, place, and time. Mental status is at baseline.  ?Psychiatric:     ?   Mood and Affect: Mood normal.     ?   Behavior: Behavior normal.     ?   Thought Content: Thought content normal.     ?   Judgment: Judgment normal.  ? ? ?BP 131/83 (BP Location: Right Arm, Patient Position: Sitting, Cuff Size: Normal)   Pulse (!) 101   Temp 98.1 ?F (36.7 ?C) (Oral)   Resp 16   Ht _0  (1.6 m)   Wt 114 lb 4.8 oz (51.8 kg)   SpO2 92%   BMI 20.25 kg/m?  ?Wt Readings from Last 3 Encounters:  ?12/09/21 114 lb 4.8 oz (51.8 kg)  ?04/14/21 116 lb 6.4 oz (52.8 kg)  ?03/19/21 114 lb 1.6 oz (51.8 kg)  ? ? ? ?Health Maintenance Due  ?Topic Date Due  ? COVID-19 Vaccine (1) Never done  ? HIV  Screening  Never done  ? Hepatitis C Screening  Never done  ? TETANUS/TDAP  Never done  ? Zoster Vaccines- Shingrix (1 of 2) Never done  ? COLONOSCOPY (Pts 45-73yr Insurance coverage will need to be confirmed)  Never done  ? PAP SMEAR-Modifier  10/14/2019  ? ? ?There are no preventive  care reminders to display for this patient. ? ?Lab Results  ?Component Value Date  ? TSH 1.120 06/25/2020  ? ?Lab Results  ?Component Value Date  ? WBC 8.4 06/25/2020  ? HGB 17.0 (H) 06/25/2020  ? HCT 49.0 (H) 06/25/2020  ? MCV 101 (H) 06/25/2020  ? PLT 320 06/25/2020  ? ?Lab Results  ?Component Value Date  ? NA 143 03/19/2021  ? K 4.9 03/19/2021  ? CO2 25 03/19/2021  ? GLUCOSE 94 03/19/2021  ? BUN 10 03/19/2021  ? CREATININE 0.69 03/19/2021  ? BILITOT <0.2 03/19/2021  ? ALKPHOS 117 03/19/2021  ? AST 29 03/19/2021  ? ALT 21 03/19/2021  ? PROT 7.3 03/19/2021  ? ALBUMIN 4.6 03/19/2021  ? CALCIUM 9.8 03/19/2021  ? ANIONGAP 7 06/28/2016  ? EGFR 101 03/19/2021  ? ?Lab Results  ?Component Value Date  ? CHOL 216 (H) 06/25/2020  ? ?Lab Results  ?Component Value Date  ? HDL 94 06/25/2020  ? ?Lab Results  ?Component Value Date  ? LDLCALC 106 (H) 06/25/2020  ? ?Lab Results  ?Component Value Date  ? TRIG 95 06/25/2020  ? ?Lab Results  ?Component Value Date  ? CHOLHDL 2.3 06/25/2020  ? ?Lab Results  ?Component Value Date  ? HGBA1C 5.0 06/25/2020  ? ? ?  ?Assessment & Plan:  ? ?1. Essential hypertension ?- Her blood is under control, she will continue on current medication, DASH diet and smoking cessation was encouraged. ?- amLODipine (NORVASC) 5 MG tablet; TAKE ONE TABLET BY MOUTH EVERY DAY  Dispense: 30 tablet; Refill: 3 ? ?2. Elevated lipids ?- She will continue on current medication, low fat/cholesterol diet. ?- rosuvastatin (CRESTOR) 5 MG tablet; TAKE ONE TABLET BY MOUTH EVERY DAY  Dispense: 30 tablet; Refill: 2 ? ?3. Allergy, subsequent encounter ?- She will continue on current medication and was encouraged on smoking cessation. ?-  fluticasone furoate-vilanterol (BREO ELLIPTA) 200-25 MCG/ACT AEPB; Inhale 1 puff into the lungs daily.  Dispense: 60 each; Refill: 3 ?- cetirizine (ZYRTEC) 10 MG tablet; Take 0.5 tablets (5 mg total) by mouth daily.  Dispe

## 2021-12-10 ENCOUNTER — Other Ambulatory Visit: Payer: Self-pay

## 2021-12-10 ENCOUNTER — Telehealth: Payer: Self-pay | Admitting: *Deleted

## 2021-12-10 ENCOUNTER — Other Ambulatory Visit: Payer: Self-pay | Admitting: Gerontology

## 2021-12-10 DIAGNOSIS — Z8709 Personal history of other diseases of the respiratory system: Secondary | ICD-10-CM

## 2021-12-10 MED ORDER — FLUTICASONE-SALMETEROL 100-50 MCG/ACT IN AEPB
1.0000 | INHALATION_SPRAY | Freq: Two times a day (BID) | RESPIRATORY_TRACT | 2 refills | Status: DC
  Filled 2021-12-10 – 2022-07-28 (×5): qty 60, 30d supply, fill #0

## 2021-12-10 NOTE — Telephone Encounter (Signed)
LMTC to schedule Yearly Lung CA CT Scan. 

## 2021-12-14 ENCOUNTER — Telehealth: Payer: Self-pay | Admitting: Pharmacist

## 2021-12-14 NOTE — Telephone Encounter (Signed)
12/14/2021 12:10:36 PM - [employer] forms to pt & provider ?-- Tarry Kos - Monday, December 14, 2021 12:08 PM --  ?Received printout from the pharmacy for [facility]. Forms mailed to pt for signature, also requested POI & taxes. Provider forms to sign in [facility] folder on my desk. ? ?

## 2021-12-23 ENCOUNTER — Encounter: Payer: Self-pay | Admitting: Gerontology

## 2021-12-23 ENCOUNTER — Other Ambulatory Visit: Payer: Self-pay

## 2021-12-23 ENCOUNTER — Ambulatory Visit: Payer: Self-pay | Admitting: Gerontology

## 2021-12-23 VITALS — BP 132/82 | HR 95 | Temp 98.1°F | Resp 16 | Ht 64.0 in | Wt 116.2 lb

## 2021-12-23 DIAGNOSIS — S80261A Insect bite (nonvenomous), right knee, initial encounter: Secondary | ICD-10-CM

## 2021-12-23 DIAGNOSIS — L03115 Cellulitis of right lower limb: Secondary | ICD-10-CM

## 2021-12-23 DIAGNOSIS — L0390 Cellulitis, unspecified: Secondary | ICD-10-CM

## 2021-12-23 DIAGNOSIS — W57XXXA Bitten or stung by nonvenomous insect and other nonvenomous arthropods, initial encounter: Secondary | ICD-10-CM | POA: Insufficient documentation

## 2021-12-23 HISTORY — DX: Cellulitis, unspecified: L03.90

## 2021-12-23 MED ORDER — CEPHALEXIN 500 MG PO CAPS
500.0000 mg | ORAL_CAPSULE | Freq: Two times a day (BID) | ORAL | 0 refills | Status: DC
  Filled 2021-12-23: qty 10, 5d supply, fill #0

## 2021-12-23 NOTE — Progress Notes (Signed)
? ?Established Patient Office Visit ? ?Subjective:  ?Patient ID: Alexis Parks, female    DOB: 08-20-1961  Age: 61 y.o. MRN: [id number] ? ?CC:  ?Chief Complaint  ?Patient presents with  ? Insect Bite  ?  Patient c/o redness and swelling where a "spider" bit her 5 days ago  ? ? ?HPI ?BLEU MOISAN pis a 61 y/o female who has history of Seasonal allergy, Asthma, Hypertension, Hyperlipidemia, presents for c/o swelling, erythema due to spider bite  5 days ago to lateral and medial aspect of right knee. The middle lesion measures approximately 0.02 cm , covered by straw colored eschar, and it's surrounded by circular erythema, approximately the size of a dollar coin. The site was warm with touch and tenderness with palpation. She denies taking any medication, but applied cold and warm compress. She denies fever, and chills. Overall, she states that she's doing well and offers no further complaint. ? ?Past Medical History:  ?Diagnosis Date  ? Allergy   ? seasonal  ? Asthma   ? Hyperlipidemia   ? Hypertension   ? ? ?Past Surgical History:  ?Procedure Laterality Date  ? AUGMENTATION MAMMAPLASTY Bilateral 25+ yrs ago  ? silicone  ? EYE SURGERY    ? cataract surgery  ? ? ?Family History  ?Problem Relation Age of Onset  ? Hyperlipidemia Mother   ? Hypertension Mother   ? Thyroid disease Mother   ? Bone cancer Father   ? COPD Sister   ? COPD Sister   ? Restless legs syndrome Sister   ? CAD Maternal Grandmother   ? Diabetes Maternal Grandfather   ? ? ?Social History  ? ?Socioeconomic History  ? Marital status: Divorced  ?  Spouse name: Not on file  ? Number of children: Not on file  ? Years of education: Not on file  ? Highest education level: Not on file  ?Occupational History  ? Not on file  ?Tobacco Use  ? Smoking status: Every Day  ?  Packs/day: 0.75  ?  Years: 42.00  ?  Pack years: 31.50  ?  Types: Cigarettes  ? Smokeless tobacco: Never  ? Tobacco comments:  ?  "i am thinking about it" decreased from 1 ppd to 3/4 ppd   ?Vaping Use  ? Vaping Use: Never used  ?Substance and Sexual Activity  ? Alcohol use: Yes  ?  Alcohol/week: 12.0 standard drinks  ?  Types: 12 Cans of beer per week  ?  Comment: 1-2 every other day; 3-4 on weekends  ? Drug use: No  ? Sexual activity: Yes  ?  Birth control/protection: Post-menopausal  ?Other Topics Concern  ? Not on file  ?Social History Narrative  ? Not on file  ? ?Social Determinants of Health  ? ?Financial Resource Strain: Not on file  ?Food Insecurity: No Food Insecurity  ? Worried About Charity fundraiser in the Last Year: Never true  ? Ran Out of Food in the Last Year: Never true  ?Transportation Needs: No Transportation Needs  ? Lack of Transportation (Medical): No  ? Lack of Transportation (Non-Medical): No  ?Physical Activity: Not on file  ?Stress: Not on file  ?Social Connections: Not on file  ?Intimate Partner Violence: Not on file  ? ? ?Outpatient Medications Prior to Visit  ?Medication Sig Dispense Refill  ? acetaminophen (TYLENOL) 325 MG tablet Take 650 mg by mouth as needed for mild pain.    ? albuterol (PROVENTIL HFA) 108 (90 Base) MCG/ACT  inhaler Inhale 2 puffs into the lungs every 6 (six) hours as needed for wheezing or shortness of breath 20.1 g 3  ? amLODipine (NORVASC) 5 MG tablet Take 1 tablet (5 mg total) by mouth once daily. 30 tablet 3  ? Blood Pressure Monitoring (BLOOD PRESSURE KIT) DEVI 1 kit by Does not apply route daily. 1 each 0  ? cetirizine (ZYRTEC) 10 MG tablet Take 1/2 tablet (5 mg total) by mouth once daily. 15 tablet 3  ? fluticasone (FLONASE) 50 MCG/ACT nasal spray PLACE 1 SPRAY INTO BOTH NOSTRILS EVERY DAY 16 g 2  ? fluticasone-salmeterol (WIXELA INHUB) 100-50 MCG/ACT AEPB Inhale 1 puff into the lungs 2 (two) times daily. (Use until Breo available) 60 each 2  ? rosuvastatin (CRESTOR) 5 MG tablet Take 1 tablet (5 mg total) by mouth once daily. 30 tablet 2  ? witch hazel-glycerin (TUCKS) pad Apply 1 application topically as needed for itching. 40 each 0  ?  predniSONE (DELTASONE) 10 MG tablet Take 1 tablet (10 mg total) by mouth once daily with breakfast. 5 tablet 0  ? ?No facility-administered medications prior to visit.  ? ? ?No Known Allergies ? ?ROS ?Review of Systems  ?Constitutional: Negative.   ?Respiratory: Negative.    ?Cardiovascular: Negative.   ?Skin:   ?     Bite with erythematous area.  ?Neurological: Negative.   ? ?  ?Objective:  ?  ?Physical Exam ?HENT:  ?   Head: Normocephalic and atraumatic.  ?Cardiovascular:  ?   Rate and Rhythm: Normal rate and regular rhythm.  ?   Pulses: Normal pulses.  ?   Heart sounds: Normal heart sounds.  ?Pulmonary:  ?   Effort: Pulmonary effort is normal.  ?   Breath sounds: Normal breath sounds.  ?Skin: ?   Findings: Erythema (to lateral and medial aspect of right knee s/p spider bite.Site swollen, warm to touch and tender with palpation) present.  ?Neurological:  ?   Mental Status: She is alert.  ? ? ?BP 132/82 (BP Location: Right Arm, Patient Position: Sitting, Cuff Size: Normal)   Pulse 95   Temp 98.1 ?F (36.7 ?C) (Oral)   Resp 16   Ht '5\' 4"'  (1.626 m)   Wt 116 lb 3.2 oz (52.7 kg)   SpO2 93%   BMI 19.95 kg/m?  ?Wt Readings from Last 3 Encounters:  ?12/23/21 116 lb 3.2 oz (52.7 kg)  ?12/09/21 114 lb 4.8 oz (51.8 kg)  ?04/14/21 116 lb 6.4 oz (52.8 kg)  ? ? ? ?Health Maintenance Due  ?Topic Date Due  ? COVID-19 Vaccine (1) Never done  ? HIV Screening  Never done  ? Hepatitis C Screening  Never done  ? TETANUS/TDAP  Never done  ? Zoster Vaccines- Shingrix (1 of 2) Never done  ? COLONOSCOPY (Pts 45-66yr Insurance coverage will need to be confirmed)  Never done  ? PAP SMEAR-Modifier  10/14/2019  ? ? ?There are no preventive care reminders to display for this patient. ? ?Lab Results  ?Component Value Date  ? TSH 1.120 06/25/2020  ? ?Lab Results  ?Component Value Date  ? WBC 8.4 06/25/2020  ? HGB 17.0 (H) 06/25/2020  ? HCT 49.0 (H) 06/25/2020  ? MCV 101 (H) 06/25/2020  ? PLT 320 06/25/2020  ? ?Lab Results  ?Component  Value Date  ? NA 143 03/19/2021  ? K 4.9 03/19/2021  ? CO2 25 03/19/2021  ? GLUCOSE 94 03/19/2021  ? BUN 10 03/19/2021  ? CREATININE 0.69 03/19/2021  ?  BILITOT <0.2 03/19/2021  ? ALKPHOS 117 03/19/2021  ? AST 29 03/19/2021  ? ALT 21 03/19/2021  ? PROT 7.3 03/19/2021  ? ALBUMIN 4.6 03/19/2021  ? CALCIUM 9.8 03/19/2021  ? ANIONGAP 7 06/28/2016  ? EGFR 101 03/19/2021  ? ?Lab Results  ?Component Value Date  ? CHOL 216 (H) 06/25/2020  ? ?Lab Results  ?Component Value Date  ? HDL 94 06/25/2020  ? ?Lab Results  ?Component Value Date  ? LDLCALC 106 (H) 06/25/2020  ? ?Lab Results  ?Component Value Date  ? TRIG 95 06/25/2020  ? ?Lab Results  ?Component Value Date  ? CHOLHDL 2.3 06/25/2020  ? ?Lab Results  ?Component Value Date  ? HGBA1C 5.0 06/25/2020  ? ? ?  ?Assessment & Plan:  ? ? ? ?1. Cellulitis of right lower extremity ?- Unable to draw cbc with differential, will prophylactically treat for cellulitis, she was advised to notify clinic for worsening symptoms. She was started on Keflex, educated on medication side effects and advised to notify clinic and go to the ED. ?- cephALEXin (KEFLEX) 500 MG capsule; Take 1 capsule (500 mg total) by mouth 2 (two) times daily.  Dispense: 10 capsule; Refill: 0 ? ?2. Insect bite of right knee, initial encounter ?- Unable to draw cbc with differential, will prophylactically treat for cellulitis, she was advised to notify clinic for worsening symptoms. She was started on Keflex, educated on medication side effects and advised to notify clinic and go to the ED. ?- cephALEXin (KEFLEX) 500 MG capsule; Take 1 capsule (500 mg total) by mouth 2 (two) times daily.  Dispense: 10 capsule; Refill: 0 ? ? ? ?Follow-up: Return in about 8 weeks (around 02/17/2022), or if symptoms worsen or fail to improve.  ? ? ?Navah Grondin Jerold Coombe, NP ?

## 2022-01-07 ENCOUNTER — Telehealth: Payer: Self-pay | Admitting: Acute Care

## 2022-01-07 NOTE — Telephone Encounter (Signed)
Returned call to patient to schedule annual LDCT.  NO answer.  Left VM with call back number ?

## 2022-01-08 NOTE — Telephone Encounter (Signed)
Attempted to call to schedule annual LDCT.  No answer. Left VM ?

## 2022-01-18 NOTE — Telephone Encounter (Signed)
Returned call but no answer.  Left VM. Please verify type of insurance ?

## 2022-01-19 NOTE — Telephone Encounter (Signed)
Called to schedule LDCT. No answer. Left VM ?

## 2022-01-20 ENCOUNTER — Other Ambulatory Visit: Payer: Self-pay

## 2022-01-20 DIAGNOSIS — F17210 Nicotine dependence, cigarettes, uncomplicated: Secondary | ICD-10-CM

## 2022-01-20 DIAGNOSIS — Z87891 Personal history of nicotine dependence: Secondary | ICD-10-CM

## 2022-01-20 DIAGNOSIS — Z122 Encounter for screening for malignant neoplasm of respiratory organs: Secondary | ICD-10-CM

## 2022-01-20 NOTE — Telephone Encounter (Signed)
Returned call from [facility] VM.  No answer.  Left patient VM to schedule LDCT ?

## 2022-01-22 ENCOUNTER — Other Ambulatory Visit: Payer: Self-pay

## 2022-02-01 ENCOUNTER — Telehealth: Payer: Self-pay | Admitting: Pharmacist

## 2022-02-01 NOTE — Telephone Encounter (Signed)
02/01/2022 2:04:57 PM - [employer] enrollment faxed to [employer] ?

## 2022-02-02 ENCOUNTER — Ambulatory Visit
Admission: RE | Admit: 2022-02-02 | Discharge: 2022-02-02 | Disposition: A | Discharge status: Home / Self Care | Payer: 59 | Source: Ambulatory Visit | Attending: Gerontology | Admitting: Gerontology

## 2022-02-02 ENCOUNTER — Ambulatory Visit: Payer: Self-pay

## 2022-02-02 DIAGNOSIS — Z87891 Personal history of nicotine dependence: Secondary | ICD-10-CM

## 2022-02-02 DIAGNOSIS — J9811 Atelectasis: Secondary | ICD-10-CM | POA: Diagnosis not present

## 2022-02-02 DIAGNOSIS — I2510 Atherosclerotic heart disease of native coronary artery without angina pectoris: Secondary | ICD-10-CM | POA: Diagnosis not present

## 2022-02-02 DIAGNOSIS — R69 Illness, unspecified: Secondary | ICD-10-CM | POA: Diagnosis not present

## 2022-02-02 DIAGNOSIS — J439 Emphysema, unspecified: Secondary | ICD-10-CM | POA: Diagnosis not present

## 2022-02-02 DIAGNOSIS — Z122 Encounter for screening for malignant neoplasm of respiratory organs: Secondary | ICD-10-CM

## 2022-02-02 DIAGNOSIS — F17210 Nicotine dependence, cigarettes, uncomplicated: Secondary | ICD-10-CM

## 2022-02-03 ENCOUNTER — Telehealth: Payer: Self-pay | Admitting: Pharmacist

## 2022-02-03 NOTE — Telephone Encounter (Signed)
02/03/2022 11:53:36 AM - [employer]ie [employer] approval from [employer] ?-- Tarry Kos - Wednesday, Feb 03, 2022 11:52 AM -- [employer] approval for [employer]ie [employer]. Pt approved through 02-01-23. ?

## 2022-02-04 ENCOUNTER — Other Ambulatory Visit: Payer: Self-pay

## 2022-02-04 DIAGNOSIS — Z87891 Personal history of nicotine dependence: Secondary | ICD-10-CM

## 2022-02-04 DIAGNOSIS — Z122 Encounter for screening for malignant neoplasm of respiratory organs: Secondary | ICD-10-CM

## 2022-02-04 DIAGNOSIS — F17210 Nicotine dependence, cigarettes, uncomplicated: Secondary | ICD-10-CM

## 2022-02-12 ENCOUNTER — Other Ambulatory Visit: Payer: Self-pay

## 2022-02-17 ENCOUNTER — Ambulatory Visit: Payer: Self-pay | Admitting: Gerontology

## 2022-02-17 ENCOUNTER — Other Ambulatory Visit: Payer: Self-pay

## 2022-02-17 ENCOUNTER — Encounter: Payer: Self-pay | Admitting: Gerontology

## 2022-02-17 VITALS — BP 135/83 | HR 102 | Temp 97.6°F | Resp 16 | Ht 64.5 in | Wt 117.0 lb

## 2022-02-17 DIAGNOSIS — F17200 Nicotine dependence, unspecified, uncomplicated: Secondary | ICD-10-CM

## 2022-02-17 DIAGNOSIS — I10 Essential (primary) hypertension: Secondary | ICD-10-CM

## 2022-02-17 NOTE — Patient Instructions (Signed)
Smoking Tobacco Information, Adult Smoking tobacco can be harmful to your health. Tobacco contains a toxic colorless chemical called nicotine. Nicotine causes changes in your brain that make you want more and more. This is called addiction. This can make it hard to stop smoking once you start. Tobacco also has other toxic chemicals that can hurt your body and raise your risk of many cancers. Menthol or "lite" tobacco or cigarette brands are not safer than regular brands. How can smoking tobacco affect me? Smoking tobacco puts you at risk for: Cancer. Smoking is most commonly associated with lung cancer, but can also lead to cancer in other parts of the body. Chronic obstructive pulmonary disease (COPD). This is a long-term lung condition that makes it hard to breathe. It also gets worse over time. High blood pressure (hypertension), heart disease, stroke, heart attack, and lung infections, such as pneumonia. Cataracts. This is when the lenses in the eyes become clouded. Digestive problems. This may include peptic ulcers, heartburn, and gastroesophageal reflux disease (GERD). Oral health problems, such as gum disease, mouth sores, and tooth loss. Loss of taste and smell. Smoking also affects how you look and smell. Smoking may cause: Wrinkles. Yellow or stained teeth, fingers, and fingernails. Bad breath. Bad-smelling clothes and hair. Smoking tobacco can also affect your social life, because: It may be challenging to find places to smoke when away from home. Many workplaces, restaurants, hotels, and public places are tobacco-free. Smoking is expensive. This is due to the cost of tobacco and the long-term costs of treating health problems from smoking. Secondhand smoke may affect those around you. Secondhand smoke can cause lung cancer, breathing problems, and heart disease. Children of smokers have a higher risk for: Sudden infant death syndrome (SIDS). Ear infections. Lung infections. What  actions can I take to prevent health problems? Quit smoking  Do not start smoking. Quit if you already smoke. Do not replace cigarette smoking with vaping devices, such as e-cigarettes. Make a plan to quit smoking and commit to it. Look for programs to help you, and ask your health care provider for recommendations and ideas. Set a date and write down all the reasons you want to quit. Let your friends and family know you are quitting so they can help and support you. Consider finding friends who also want to quit. It can be easier to quit with someone else, so that you can support each other. Talk with your health care provider about using nicotine replacement medicines to help you quit. These include gum, lozenges, patches, sprays, or pills. If you try to quit but return to smoking, stay positive. It is common to slip up when you first quit, so take it one day at a time. Be prepared for cravings. When you feel the urge to smoke, chew gum or suck on hard candy. Lifestyle Stay busy. Take care of your body. Get plenty of exercise, eat a healthy diet, and drink plenty of water. Find ways to manage your stress, such as meditation, yoga, exercise, or time spent with friends and family. Ask your health care provider about having regular tests (screenings) to check for cancer. This may include blood tests, imaging tests, and other tests. Where to find support To get support to quit smoking, consider: Asking your health care provider for more information and resources. Joining a support group for people who want to quit smoking in your local community. There are many effective programs that may help you to quit. Calling the smokefree.gov counselor   helpline at 1-800-QUIT-NOW (1-800-784-8669). Where to find more information You may find more information about quitting smoking from: Centers for Disease Control and Prevention: cdc.gov/tobacco Smokefree.gov: smokefree.gov American Lung Association:  freedomfromsmoking.org Contact a health care provider if: You have problems breathing. Your lips, nose, or fingers turn blue. You have chest pain. You are coughing up blood. You feel like you will faint. You have other health changes that cause you to worry. Summary Smoking tobacco can negatively affect your health, the health of those around you, your finances, and your social life. Do not start smoking. Quit if you already smoke. If you need help quitting, ask your health care provider. Consider joining a support group for people in your local community who want to quit smoking. There are many effective programs that may help you to quit. This information is not intended to replace advice given to you by your health care provider. Make sure you discuss any questions you have with your health care provider. Document Revised: 09/01/2021 Document Reviewed: 09/01/2021 Elsevier Patient Education  2023 Elsevier Inc.  

## 2022-02-17 NOTE — Progress Notes (Signed)
Established Patient Office Visit  Subjective   Patient ID: Alexis Parks, female    DOB: 06-08-1961  Age: 61 y.o. MRN: [id number]  Chief Complaint  Patient presents with   Follow-up    HPI  Alexis Parks pis a 61 y/o female who has history of Seasonal allergy, Asthma, Hypertension, Hyperlipidemia who presents for follow up visit. She did not have her labs done prior to visit,  and has active health insurance and will follow up with another provider for 6 months. She had low dose lung CT scan done on 02/02/22 and  Lung-RADS 2, benign appearance or behavior. Aortic Atherosclerosis (ICD10-I70.0) and Emphysema (ICD10-J43.9. She continues to smoke 3/4 pack of cigarette daily and admits the desire to quit. She states that she's compliant with her medications and continues to make healthy lifestyle changes.  Overall, she states that she is doing well and offers no further complaint.  Review of Systems  Constitutional: Negative.   Respiratory: Negative.    Cardiovascular: Negative.   Neurological: Negative.   Psychiatric/Behavioral: Negative.       Objective:     BP 135/83 (BP Location: Right Arm, Patient Position: Sitting, Cuff Size: Normal)   Pulse (!) 102   Temp 97.6 F (36.4 C) (Oral)   Resp 16   Ht 5' 4.5" (1.638 m)   Wt 117 lb (53.1 kg)   SpO2 93%   BMI 19.77 kg/m  BP Readings from Last 3 Encounters:  02/17/22 135/83  12/23/21 132/82  12/09/21 131/83   Wt Readings from Last 3 Encounters:  02/17/22 117 lb (53.1 kg)  12/23/21 116 lb 3.2 oz (52.7 kg)  12/09/21 114 lb 4.8 oz (51.8 kg)      Physical Exam HENT:     Head: Normocephalic and atraumatic.  Eyes:     Extraocular Movements: Extraocular movements intact.     Conjunctiva/sclera: Conjunctivae normal.     Pupils: Pupils are equal, round, and reactive to light.  Cardiovascular:     Rate and Rhythm: Regular rhythm. Tachycardia present.     Pulses: Normal pulses.     Heart sounds: Normal heart sounds.   Pulmonary:     Effort: Pulmonary effort is normal.     Breath sounds: Normal breath sounds.  Neurological:     General: No focal deficit present.     Mental Status: She is alert and oriented to person, place, and time. Mental status is at baseline.  Psychiatric:        Mood and Affect: Mood normal.        Behavior: Behavior normal.        Thought Content: Thought content normal.        Judgment: Judgment normal.     No results found for any visits on 02/17/22.  Last CBC Lab Results  Component Value Date   WBC 8.4 06/25/2020   HGB 17.0 (H) 06/25/2020   HCT 49.0 (H) 06/25/2020   MCV 101 (H) 06/25/2020   MCH 35.2 (H) 06/25/2020   RDW 11.6 (L) 06/25/2020   PLT 320 18/40/3754   Last metabolic panel Lab Results  Component Value Date   GLUCOSE 94 03/19/2021   NA 143 03/19/2021   K 4.9 03/19/2021   CL 101 03/19/2021   CO2 25 03/19/2021   BUN 10 03/19/2021   CREATININE 0.69 03/19/2021   EGFR 101 03/19/2021   CALCIUM 9.8 03/19/2021   PROT 7.3 03/19/2021   ALBUMIN 4.6 03/19/2021   LABGLOB 2.7 03/19/2021  AGRATIO 1.7 03/19/2021   BILITOT <0.2 03/19/2021   ALKPHOS 117 03/19/2021   AST 29 03/19/2021   ALT 21 03/19/2021   ANIONGAP 7 06/28/2016   Last lipids Lab Results  Component Value Date   CHOL 216 (H) 06/25/2020   HDL 94 06/25/2020   LDLCALC 106 (H) 06/25/2020   TRIG 95 06/25/2020   CHOLHDL 2.3 06/25/2020   Last hemoglobin A1c Lab Results  Component Value Date   HGBA1C 5.0 06/25/2020      The 10-year ASCVD risk score (Arnett DK, et al., 2019) is: 6.9%    Assessment & Plan:   1. Essential hypertension -Her blood pressure is under control, she will continue current medication, DASH diet and exercise as tolerated.  Her heart rate was 102 bpm, she denies chest pain, palpitation and was encouraged to increase fluid intake.  She was advised to go to the emergency room for worsening symptoms.  2. Smoking -She was encouraged on smoking cessation, provided  with [place] information on advised to schedule an appointment.   No follow-ups on file.  She has active insurance for 6 months, and will follow-up with a provider.  She was advised [facility] if her insurance lapses.  She was provided with [facility] information and advised to call and schedule a new patient appointment.  [facility] wishes how well with her care.   Costa Jha Jerold Coombe, NP

## 2022-02-24 ENCOUNTER — Other Ambulatory Visit: Payer: Self-pay | Admitting: Pharmacy Technician

## 2022-02-24 ENCOUNTER — Ambulatory Visit: Payer: 59 | Admitting: Nurse Practitioner

## 2022-02-24 NOTE — Patient Outreach (Signed)
Patient has prescription drug coverage with [employer].  No longer qualifies for free medication.  Sherilyn Dacosta Care Manager [facility]

## 2022-02-24 NOTE — Progress Notes (Deleted)
There were no vitals taken for this visit.   Subjective:    Patient ID: Alexis Parks, female    DOB: August 29, 1961, 61 y.o.   MRN: [id number]  HPI: Alexis Parks is a 61 y.o. female  No chief complaint on file.  Patient presents to clinic to establish care with new PCP.  Introduced to Designer, [employer]ellery role and practice setting.  All questions answered.  Discussed provider/patient relationship and expectations.  Patient reports a history of ***. Patient denies a history of: Hypertension, Elevated Cholesterol, Diabetes, Thyroid problems, Depression, Anxiety, Neurological problems, and Abdominal problems.   Active Ambulatory Problems    Diagnosis Date Noted   Encounter to establish care 06/25/2020   History of asthma 06/25/2020   Smoking 06/25/2020   Fatigue 06/25/2020   Symptoms of urinary tract infection 06/25/2020   Cough 06/25/2020   Lump of skin of lower extremity, left 06/25/2020   Blurry vision, right eye 06/25/2020   Allergies 07/09/2020   Elevated lipids 07/09/2020   Elevated liver enzymes 08/13/2020   Essential hypertension 08/13/2020   Polycythemia 08/13/2020   Ectatic aorta (Edie) 08/28/2020   Dysphoric mood 11/27/2020   Sinus infection 01/28/2021   Wheezing on left side of chest on exhalation 02/26/2021   History of hemorrhoids 03/19/2021   Rash 04/14/2021   Cellulitis 12/23/2021   Insect bite 12/23/2021   Resolved Ambulatory Problems    Diagnosis Date Noted   No Resolved Ambulatory Problems   Past Medical History:  Diagnosis Date   Allergy    Asthma    Hyperlipidemia    Hypertension    Past Surgical History:  Procedure Laterality Date   AUGMENTATION MAMMAPLASTY Bilateral 06+ yrs ago   silicone   EYE SURGERY     cataract surgery   Family History  Problem Relation Age of Onset   Hyperlipidemia Mother    Hypertension Mother    Thyroid disease Mother    Bone cancer Father    COPD Sister    COPD Sister    Restless legs syndrome Sister     CAD Maternal Grandmother    Diabetes Maternal Grandfather      Review of Systems  Per HPI unless specifically indicated above     Objective:    There were no vitals taken for this visit.  Wt Readings from Last 3 Encounters:  02/17/22 117 lb (53.1 kg)  12/23/21 116 lb 3.2 oz (52.7 kg)  12/09/21 114 lb 4.8 oz (51.8 kg)    Physical Exam  Results for orders placed or performed in visit on 03/19/21  CK (Creatine Kinase)  Result Value Ref Range   Total CK 72 32 - 182 U/L  Comp Met (CMET)  Result Value Ref Range   Glucose 94 65 - 99 mg/dL   BUN 10 6 - 24 mg/dL   Creatinine, Ser 0.69 0.57 - 1.00 mg/dL   eGFR 101 >59 mL/min/1.73   BUN/Creatinine Ratio 14 9 - 23   Sodium 143 134 - 144 mmol/L   Potassium 4.9 3.5 - 5.2 mmol/L   Chloride 101 96 - 106 mmol/L   CO2 25 20 - 29 mmol/L   Calcium 9.8 8.7 - 10.2 mg/dL   Total Protein 7.3 6.0 - 8.5 g/dL   Albumin 4.6 3.8 - 4.9 g/dL   Globulin, Total 2.7 1.5 - 4.5 g/dL   Albumin/Globulin Ratio 1.7 1.2 - 2.2   Bilirubin Total <0.2 0.0 - 1.2 mg/dL   Alkaline Phosphatase 117 44 - 121 IU/L  AST 29 0 - 40 IU/L   ALT 21 0 - 32 IU/L  Vitamin D (25 hydroxy)  Result Value Ref Range   Vit D, 25-Hydroxy 35.3 30.0 - 100.0 ng/mL      Assessment & Plan:   Problem List Items Addressed This Visit       Cardiovascular and Mediastinum   Essential hypertension - Primary     Other   Encounter to establish care   Elevated lipids   Dysphoric mood     Follow up plan: No follow-ups on file.

## 2022-03-02 ENCOUNTER — Other Ambulatory Visit: Payer: Self-pay

## 2022-03-02 MED ORDER — FLUTICASONE FUROATE-VILANTEROL 200-25 MCG/ACT IN AEPB
INHALATION_SPRAY | RESPIRATORY_TRACT | 3 refills | Status: DC
  Filled 2022-03-02: qty 180, 90d supply, fill #0
  Filled 2022-07-02: qty 60, 30d supply, fill #0

## 2022-03-15 ENCOUNTER — Other Ambulatory Visit (HOSPITAL_COMMUNITY): Payer: Self-pay

## 2022-03-26 ENCOUNTER — Ambulatory Visit: Payer: 59 | Admitting: Nurse Practitioner

## 2022-04-02 ENCOUNTER — Other Ambulatory Visit: Payer: Self-pay

## 2022-04-23 ENCOUNTER — Ambulatory Visit: Payer: 59 | Admitting: Nurse Practitioner

## 2022-05-14 ENCOUNTER — Encounter: Payer: Self-pay | Admitting: Nurse Practitioner

## 2022-05-14 ENCOUNTER — Ambulatory Visit (INDEPENDENT_AMBULATORY_CARE_PROVIDER_SITE_OTHER): Payer: 59 | Admitting: Nurse Practitioner

## 2022-05-14 VITALS — BP 112/73 | HR 86 | Ht 64.5 in | Wt 114.1 lb

## 2022-05-14 DIAGNOSIS — Z1211 Encounter for screening for malignant neoplasm of colon: Secondary | ICD-10-CM | POA: Diagnosis not present

## 2022-05-14 DIAGNOSIS — I10 Essential (primary) hypertension: Secondary | ICD-10-CM | POA: Diagnosis not present

## 2022-05-14 DIAGNOSIS — F17200 Nicotine dependence, unspecified, uncomplicated: Secondary | ICD-10-CM

## 2022-05-14 DIAGNOSIS — J45909 Unspecified asthma, uncomplicated: Secondary | ICD-10-CM | POA: Diagnosis not present

## 2022-05-14 DIAGNOSIS — R69 Illness, unspecified: Secondary | ICD-10-CM | POA: Diagnosis not present

## 2022-05-14 DIAGNOSIS — H9311 Tinnitus, right ear: Secondary | ICD-10-CM | POA: Diagnosis not present

## 2022-05-14 DIAGNOSIS — E785 Hyperlipidemia, unspecified: Secondary | ICD-10-CM | POA: Diagnosis not present

## 2022-05-14 NOTE — Progress Notes (Unsigned)
New Patient Office Visit  Subjective    Patient ID: Alexis Parks, female    DOB: Mar 21, 1961  Age: 61 y.o. MRN: [id number]  CC:  Chief Complaint  Patient presents with   New Patient (Initial Visit)    HPI Alexis Parks presents to establish care. [facility]. She complains of ringing in the ear since 6 months.  ***  Outpatient Encounter Medications as of 05/14/2022  Medication Sig   acetaminophen (TYLENOL) 325 MG tablet Take 650 mg by mouth as needed for mild pain.   albuterol (PROVENTIL HFA) 108 (90 Base) MCG/ACT inhaler Inhale 2 puffs into the lungs every 6 (six) hours as needed for wheezing or shortness of breath   amLODipine (NORVASC) 5 MG tablet Take 1 tablet (5 mg total) by mouth once daily.   cetirizine (ZYRTEC) 10 MG tablet Take 1/2 tablet (5 mg total) by mouth once daily.   fluticasone furoate-vilanterol (BREO ELLIPTA) 200-25 MCG/ACT AEPB Inhale one puff into the lungs daily   fluticasone-salmeterol (WIXELA INHUB) 100-50 MCG/ACT AEPB Inhale 1 puff into the lungs 2 (two) times daily. (Use until Breo available)   rosuvastatin (CRESTOR) 5 MG tablet Take 1 tablet (5 mg total) by mouth once daily.   witch hazel-glycerin (TUCKS) pad Apply 1 application topically as needed for itching.   [DISCONTINUED] Blood Pressure Monitoring (BLOOD PRESSURE KIT) DEVI 1 kit by Does not apply route daily. (Patient not taking: Reported on 02/17/2022)   [DISCONTINUED] fluticasone (FLONASE) 50 MCG/ACT nasal spray PLACE 1 SPRAY INTO BOTH NOSTRILS EVERY DAY   No facility-administered encounter medications on file as of 05/14/2022.    Past Medical History:  Diagnosis Date   Allergy    seasonal   Asthma    Hyperlipidemia    Hypertension     Past Surgical History:  Procedure Laterality Date   AUGMENTATION MAMMAPLASTY Bilateral 69+ yrs ago   silicone   EYE SURGERY     cataract surgery    Family History  Problem Relation Age of Onset   Hyperlipidemia Mother     Hypertension Mother    Thyroid disease Mother    Bone cancer Father    COPD Sister    COPD Sister    Restless legs syndrome Sister    CAD Maternal Grandmother    Diabetes Maternal Grandfather     Social History   Socioeconomic History   Marital status: Divorced    Spouse name: Not on file   Number of children: Not on file   Years of education: Not on file   Highest education level: Not on file  Occupational History   Not on file  Tobacco Use   Smoking status: Every Day    Packs/day: 0.75    Years: 42.00    Total pack years: 31.50    Types: Cigarettes   Smokeless tobacco: Never   Tobacco comments:    "i am thinking about it" decreased from 1 ppd to 3/4 ppd  Vaping Use   Vaping Use: Never used  Substance and Sexual Activity   Alcohol use: Yes    Alcohol/week: 12.0 standard drinks of alcohol    Types: 12 Cans of beer per week    Comment: 1-2 every other day; 3-4 on weekends   Drug use: No   Sexual activity: Yes    Birth control/protection: Post-menopausal  Other Topics Concern   Not on file  Social History Narrative   Not on file   Social Determinants of  Health   Financial Resource Strain: Not on file  Food Insecurity: No Food Insecurity (12/23/2021)   Hunger Vital Sign    Worried About Running Out of Food in the Last Year: Never true    Ran Out of Food in the Last Year: Never true  Transportation Needs: No Transportation Needs (12/23/2021)   PRAPARE - Hydrologist (Medical): No    Lack of Transportation (Non-Medical): No  Physical Activity: Not on file  Stress: Not on file  Social Connections: Not on file  Intimate Partner Violence: Not on file    Review of Systems  Constitutional: Negative.   Skin: Negative.         Objective    BP 112/73   Pulse 86   Ht 5' 4.5" (1.638 m)   Wt 114 lb 1.6 oz (51.8 kg)   BMI 19.28 kg/m   Physical Exam Pulmonary:     Breath sounds: Rales present.     {Labs (Optional):23779}     Assessment & Plan:   Problem List Items Addressed This Visit   None   No follow-ups on file.   Theresia Lo, NP

## 2022-05-15 ENCOUNTER — Encounter: Payer: Self-pay | Admitting: Nurse Practitioner

## 2022-05-15 DIAGNOSIS — H9311 Tinnitus, right ear: Secondary | ICD-10-CM | POA: Insufficient documentation

## 2022-05-15 DIAGNOSIS — J45909 Unspecified asthma, uncomplicated: Secondary | ICD-10-CM | POA: Insufficient documentation

## 2022-05-15 NOTE — Assessment & Plan Note (Signed)
Smoking cessation was discussed, 5-7 minutes was spent of this topic specifically.   

## 2022-05-15 NOTE — Assessment & Plan Note (Signed)
Continue statin therapy for cardiovascular risk reduction. 

## 2022-05-15 NOTE — Assessment & Plan Note (Addendum)
Blood pressure within normal limits. Encourage her to eat low-salt and heart healthy diet. From regular physical activity for at least 30 minutes a day. Continue amlodipine. Labs ordered.

## 2022-05-15 NOTE — Assessment & Plan Note (Addendum)
Continue albuterol and Ellipta. Encouraged patient to quit smoking.

## 2022-05-15 NOTE — Assessment & Plan Note (Signed)
Tinnitus going on from 6 months. Negative cerumen impaction, ear infection or drum perforation Referral placed to ENT.

## 2022-05-18 ENCOUNTER — Other Ambulatory Visit: Payer: Self-pay

## 2022-05-18 ENCOUNTER — Ambulatory Visit (INDEPENDENT_AMBULATORY_CARE_PROVIDER_SITE_OTHER): Payer: 59 | Admitting: Internal Medicine

## 2022-05-18 ENCOUNTER — Telehealth: Payer: Self-pay

## 2022-05-18 DIAGNOSIS — Z13220 Encounter for screening for lipoid disorders: Secondary | ICD-10-CM

## 2022-05-18 DIAGNOSIS — Z1211 Encounter for screening for malignant neoplasm of colon: Secondary | ICD-10-CM

## 2022-05-18 DIAGNOSIS — E785 Hyperlipidemia, unspecified: Secondary | ICD-10-CM | POA: Diagnosis not present

## 2022-05-18 DIAGNOSIS — I10 Essential (primary) hypertension: Secondary | ICD-10-CM

## 2022-05-18 DIAGNOSIS — F17200 Nicotine dependence, unspecified, uncomplicated: Secondary | ICD-10-CM

## 2022-05-18 DIAGNOSIS — R69 Illness, unspecified: Secondary | ICD-10-CM | POA: Diagnosis not present

## 2022-05-18 MED ORDER — NA SULFATE-K SULFATE-MG SULF 17.5-3.13-1.6 GM/177ML PO SOLN: 1.0000 | Freq: Once | ORAL | 0 refills | Status: AC

## 2022-05-18 NOTE — Telephone Encounter (Signed)
Gastroenterology Pre-Procedure Review  Request Date: 06/01/22 Requesting Physician: Dr. Servando Snare  PATIENT REVIEW QUESTIONS: The patient responded to the following health history questions as indicated:    1. Are you having any GI issues? yes (has bowel movements 1 every 3 days, and little occasional blood) 2. Do you have a personal history of Polyps? no 3. Do you have a family history of Colon Cancer or Polyps? no 4. Diabetes Mellitus? no 5. Joint replacements in the past 12 months?no 6. Major health problems in the past 3 months?no 7. Any artificial heart valves, MVP, or defibrillator?no    MEDICATIONS & ALLERGIES:    Patient reports the following regarding taking any anticoagulation/antiplatelet therapy:   Plavix, Coumadin, Eliquis, Xarelto, Lovenox, Pradaxa, Brilinta, or Effient? no Aspirin? no  Patient confirms/reports the following medications:  Current Outpatient Medications  Medication Sig Dispense Refill   acetaminophen (TYLENOL) 325 MG tablet Take 650 mg by mouth as needed for mild pain.     albuterol (PROVENTIL HFA) 108 (90 Base) MCG/ACT inhaler Inhale 2 puffs into the lungs every 6 (six) hours as needed for wheezing or shortness of breath 20.1 g 3   amLODipine (NORVASC) 5 MG tablet Take 1 tablet (5 mg total) by mouth once daily. 30 tablet 3   cetirizine (ZYRTEC) 10 MG tablet Take 1/2 tablet (5 mg total) by mouth once daily. 15 tablet 3   fluticasone furoate-vilanterol (BREO ELLIPTA) 200-25 MCG/ACT AEPB Inhale one puff into the lungs daily 180 each 3   fluticasone-salmeterol (WIXELA INHUB) 100-50 MCG/ACT AEPB Inhale 1 puff into the lungs 2 (two) times daily. (Use until Breo available) 60 each 2   rosuvastatin (CRESTOR) 5 MG tablet Take 1 tablet (5 mg total) by mouth once daily. 30 tablet 2   witch hazel-glycerin (TUCKS) pad Apply 1 application topically as needed for itching. 40 each 0   No current facility-administered medications for this visit.    Patient confirms/reports  the following allergies:  No Known Allergies  No orders of the defined types were placed in this encounter.   AUTHORIZATION INFORMATION Primary Insurance: 1D#: Group #:  Secondary Insurance: 1D#: Group #:  SCHEDULE INFORMATION: Date: 06/01/22 Time: Location: [facility]

## 2022-05-19 LAB — TSH: TSH: 1.84 mIU/L (ref 0.40–4.50)

## 2022-05-19 LAB — COMPREHENSIVE METABOLIC PANEL
AG Ratio: 1.2 (calc) (ref 1.0–2.5)
ALT: 11 U/L (ref 6–29)
AST: 26 U/L (ref 10–35)
Albumin: 4.1 g/dL (ref 3.6–5.1)
Alkaline phosphatase (APISO): 118 U/L (ref 37–153)
BUN/Creatinine Ratio: 7 (calc) (ref 6–22)
BUN: 4 mg/dL — ABNORMAL LOW (ref 7–25)
CO2: 21 mmol/L (ref 20–32)
Calcium: 9.9 mg/dL (ref 8.6–10.4)
Chloride: 104 mmol/L (ref 98–110)
Creat: 0.61 mg/dL (ref 0.50–1.03)
Globulin: 3.5 g/dL (calc) (ref 1.9–3.7)
Glucose, Bld: 125 mg/dL — ABNORMAL HIGH (ref 65–99)
Potassium: 4.4 mmol/L (ref 3.5–5.3)
Sodium: 139 mmol/L (ref 135–146)
Total Bilirubin: 0.5 mg/dL (ref 0.2–1.2)
Total Protein: 7.6 g/dL (ref 6.1–8.1)

## 2022-05-19 LAB — CBC WITH DIFFERENTIAL/PLATELET
Absolute Monocytes: 855 cells/uL (ref 200–950)
Basophils Absolute: 86 cells/uL (ref 0–200)
Basophils Relative: 0.9 %
Eosinophils Absolute: 323 cells/uL (ref 15–500)
Eosinophils Relative: 3.4 %
HCT: 49.4 % — ABNORMAL HIGH (ref 35.0–45.0)
Hemoglobin: 17.4 g/dL — ABNORMAL HIGH (ref 11.7–15.5)
Lymphs Abs: 3050 cells/uL (ref 850–3900)
MCH: 33.2 pg — ABNORMAL HIGH (ref 27.0–33.0)
MCHC: 35.2 g/dL (ref 32.0–36.0)
MCV: 94.3 fL (ref 80.0–100.0)
MPV: 9 fL (ref 7.5–12.5)
Monocytes Relative: 9 %
Neutro Abs: 5187 cells/uL (ref 1500–7800)
Neutrophils Relative %: 54.6 %
Platelets: 471 10*3/uL — ABNORMAL HIGH (ref 140–400)
RBC: 5.24 10*6/uL — ABNORMAL HIGH (ref 3.80–5.10)
RDW: 11.8 % (ref 11.0–15.0)
Total Lymphocyte: 32.1 %
WBC: 9.5 10*3/uL (ref 3.8–10.8)

## 2022-05-19 LAB — LIPID PANEL
Cholesterol: 148 mg/dL (ref <200)
HDL: 62 mg/dL (ref >=50)
LDL Cholesterol (Calc): 66 mg/dL (calc)
Non-HDL Cholesterol (Calc): 86 mg/dL (calc) (ref <130)
Total CHOL/HDL Ratio: 2.4 (calc) (ref <5.0)
Triglycerides: 121 mg/dL (ref <150)

## 2022-05-19 NOTE — Progress Notes (Signed)
Pt presents today for blood draw requested per Dr Juel Burrow

## 2022-06-01 ENCOUNTER — Ambulatory Visit: Payer: 59 | Admitting: Anesthesiology

## 2022-06-01 ENCOUNTER — Encounter
Admission: RE | Disposition: A | Discharge status: Home / Self Care | Payer: Self-pay | Source: Home / Self Care | Attending: Gastroenterology

## 2022-06-01 ENCOUNTER — Encounter: Payer: Self-pay | Admitting: Gastroenterology

## 2022-06-01 ENCOUNTER — Ambulatory Visit
Admission: RE | Admit: 2022-06-01 | Discharge: 2022-06-01 | Disposition: A | Discharge status: Home / Self Care | Payer: 59 | Attending: Gastroenterology | Admitting: Gastroenterology

## 2022-06-01 DIAGNOSIS — F17210 Nicotine dependence, cigarettes, uncomplicated: Secondary | ICD-10-CM | POA: Insufficient documentation

## 2022-06-01 DIAGNOSIS — K635 Polyp of colon: Secondary | ICD-10-CM

## 2022-06-01 DIAGNOSIS — J45909 Unspecified asthma, uncomplicated: Secondary | ICD-10-CM | POA: Insufficient documentation

## 2022-06-01 DIAGNOSIS — Z1211 Encounter for screening for malignant neoplasm of colon: Secondary | ICD-10-CM | POA: Diagnosis not present

## 2022-06-01 DIAGNOSIS — D126 Benign neoplasm of colon, unspecified: Secondary | ICD-10-CM | POA: Diagnosis not present

## 2022-06-01 DIAGNOSIS — R69 Illness, unspecified: Secondary | ICD-10-CM | POA: Diagnosis not present

## 2022-06-01 DIAGNOSIS — D128 Benign neoplasm of rectum: Secondary | ICD-10-CM | POA: Diagnosis not present

## 2022-06-01 HISTORY — PX: COLONOSCOPY WITH PROPOFOL: SHX5780

## 2022-06-01 SURGERY — COLONOSCOPY WITH PROPOFOL
Anesthesia: General

## 2022-06-01 MED ORDER — GLYCOPYRROLATE 0.2 MG/ML IJ SOLN
INTRAMUSCULAR | Status: DC | PRN
  Administered 2022-06-01: .1 mg via INTRAVENOUS

## 2022-06-01 MED ORDER — PROPOFOL 10 MG/ML IV BOLUS
INTRAVENOUS | Status: DC | PRN
  Administered 2022-06-01 (×2): 30 mg via INTRAVENOUS
  Administered 2022-06-01: 60 mg via INTRAVENOUS

## 2022-06-01 MED ORDER — SODIUM CHLORIDE 0.9 % IV SOLN
INTRAVENOUS | Status: DC
  Administered 2022-06-01: 1000 mL via INTRAVENOUS

## 2022-06-01 MED ORDER — GLYCOPYRROLATE 0.2 MG/ML IJ SOLN
INTRAMUSCULAR | Status: AC
  Filled 2022-06-01: qty 1

## 2022-06-01 MED ORDER — LIDOCAINE HCL (CARDIAC) PF 100 MG/5ML IV SOSY
PREFILLED_SYRINGE | INTRAVENOUS | Status: DC | PRN
  Administered 2022-06-01: 80 mg via INTRAVENOUS
  Administered 2022-06-01: 20 mg via INTRAVENOUS

## 2022-06-01 MED ORDER — DEXMEDETOMIDINE HCL 200 MCG/2ML IV SOLN
INTRAVENOUS | Status: DC | PRN
  Administered 2022-06-01: 10 ug via INTRAVENOUS

## 2022-06-01 MED ORDER — PROPOFOL 500 MG/50ML IV EMUL
INTRAVENOUS | Status: DC | PRN
  Administered 2022-06-01: 200 ug/kg/min via INTRAVENOUS

## 2022-06-01 NOTE — Op Note (Signed)
[facility] Patient Name: Alexis Parks Procedure Date: 06/01/2022 10:21 AM MRN: [id number] Account #: [id number] Date of Birth: 08/22/1961 Admit Type: Outpatient Age: 61 Room: [facility] ENDO ROOM 4 Gender: Female Note Status: Finalized Instrument Name: Prentice Docker [id number] Procedure:             Colonoscopy Indications:           Screening for colorectal malignant neoplasm Providers:             Midge Minium MD, MD Referring MD:          Corky Downs, MD (Referring MD) Medicines:             Propofol per Anesthesia Complications:         No immediate complications. Procedure:             Pre-Anesthesia Assessment:                        - Prior to the procedure, a History and Physical was                         performed, and patient medications and allergies were                         reviewed. The patient's tolerance of previous                         anesthesia was also reviewed. The risks and benefits                         of the procedure and the sedation options and risks                         were discussed with the patient. All questions were                         answered, and informed consent was obtained. Prior                         Anticoagulants: The patient has taken no previous                         anticoagulant or antiplatelet agents. ASA Grade                         Assessment: II - A patient with mild systemic disease.                         After reviewing the risks and benefits, the patient                         was deemed in satisfactory condition to undergo the                         procedure.                        After obtaining informed consent, the colonoscope was  passed under direct vision. Throughout the procedure,                         the patient's blood pressure, pulse, and oxygen                         saturations were monitored continuously. The                          Colonoscope was introduced through the anus and                         advanced to the the cecum, identified by appendiceal                         orifice and ileocecal valve. The colonoscopy was                         performed without difficulty. The patient tolerated                         the procedure well. The quality of the bowel                         preparation was excellent. Findings:      The perianal and digital rectal examinations were normal.      A 15 mm polyp was found in the transverse colon. The polyp was sessile.       Preparations were made for mucosal resection. Chromoscopy with indigo       carmine was done to mark the borders of the lesion. Saline with indigo       carmine was injected to raise the lesion. Snare mucosal resection was       performed. Resection and retrieval were complete. To close a defect       after polypectomy, three hemostatic clips were successfully placed (MR       conditional). There was no bleeding at the end of the procedure.      A 3 mm polyp was found in the sigmoid colon. The polyp was sessile. The       polyp was removed with a cold biopsy forceps. Resection and retrieval       were complete.      A 8 mm polyp was found in the rectum. The polyp was pedunculated. The       polyp was removed with a hot snare. Resection and retrieval were       complete. Impression:            - One 15 mm polyp in the transverse colon, removed                         with mucosal resection. Resected and retrieved. Clips                         (MR conditional) were placed.                        - One 3 mm polyp in the sigmoid colon, removed with a  cold biopsy forceps. Resected and retrieved.                        - One 8 mm polyp in the rectum, removed with a hot                         snare. Resected and retrieved.                        - Mucosal resection was performed. Resection and                         retrieval  were complete. Recommendation:        - Discharge patient to home.                        - Resume previous diet.                        - Continue present medications.                        - Await pathology results.                        - Repeat colonoscopy in 1 year for surveillance. Procedure Code(s):     --- Professional ---                        [phone], Colonoscopy, flexible; with endoscopic mucosal                         resection                        45380, 59, Colonoscopy, flexible; with biopsy, single                         or multiple Diagnosis Code(s):     --- Professional ---                        Z12.11, Encounter for screening for malignant neoplasm                         of colon                        K63.5, Polyp of colon CPT copyright 2019 American Medical Association. All rights reserved. The codes documented in this report are preliminary and upon coder review may  be revised to meet current compliance requirements. Midge Minium MD, MD 06/01/2022 11:25:44 AM This report has been signed electronically. Number of Addenda: 0 Note Initiated On: 06/01/2022 10:21 AM Scope Withdrawal Time: 0 hours 28 minutes 42 seconds  Total Procedure Duration: 0 hours 36 minutes 25 seconds  Estimated Blood Loss:  Estimated blood loss: none.      [facility]

## 2022-06-01 NOTE — Anesthesia Preprocedure Evaluation (Addendum)
Anesthesia Evaluation  Patient identified by MRN, date of birth, ID band Patient awake    Reviewed: Allergy & Precautions, NPO status , Patient's Chart, lab work & pertinent test results  Airway Mallampati: III  TM Distance: >3 FB Neck ROM: full    Dental  (+) Edentulous Upper, Missing   Pulmonary asthma , Current SmokerPatient did not abstain from smoking.,  Possible COPD based of patients history of daily inhaler needs and continued smoking    + wheezing      Cardiovascular Exercise Tolerance: Good hypertension, Normal cardiovascular exam     Neuro/Psych negative neurological ROS  negative psych ROS   GI/Hepatic negative GI ROS, Neg liver ROS,   Endo/Other  negative endocrine ROS  Renal/GU negative Renal ROS  negative genitourinary   Musculoskeletal   Abdominal Normal abdominal exam  (+)   Peds  Hematology negative hematology ROS (+)   Anesthesia Other Findings Past Medical History: No date: Allergy     Comment:  seasonal No date: Asthma No date: Hyperlipidemia No date: Hypertension  Past Surgical History: 25+ yrs ago: AUGMENTATION MAMMAPLASTY; Bilateral     Comment:  silicone No date: EYE SURGERY     Comment:  cataract surgery     Reproductive/Obstetrics negative OB ROS                            Anesthesia Physical Anesthesia Plan  ASA: 3  Anesthesia Plan: General   Post-op Pain Management: Minimal or no pain anticipated   Induction: Intravenous  PONV Risk Score and Plan: Propofol infusion and TIVA  Airway Management Planned: Natural Airway  Additional Equipment:   Intra-op Plan:   Post-operative Plan:   Informed Consent: I have reviewed the patients History and Physical, chart, labs and discussed the procedure including the risks, benefits and alternatives for the proposed anesthesia with the patient or authorized representative who has indicated his/her  understanding and acceptance.     Dental Advisory Given  Plan Discussed with: Anesthesiologist, CRNA and Surgeon  Anesthesia Plan Comments:        Anesthesia Quick Evaluation

## 2022-06-01 NOTE — H&P (Signed)
Midge Minium, MD [facility] [address] [place], [place] [address] Phone: [phone] Fax : [phone]  Primary Care Physician:  Corky Downs, MD Primary Gastroenterologist:  Dr. Servando Snare  Pre-Procedure History & Physical: HPI:  Alexis Parks is a 61 y.o. female is here for a screening colonoscopy.   Past Medical History:  Diagnosis Date   Allergy    seasonal   Asthma    Hyperlipidemia    Hypertension     Past Surgical History:  Procedure Laterality Date   AUGMENTATION MAMMAPLASTY Bilateral 25+ yrs ago   silicone   EYE SURGERY     cataract surgery    Prior to Admission medications   Medication Sig Start Date End Date Taking? Authorizing Provider  albuterol (PROVENTIL HFA) 108 (90 Base) MCG/ACT inhaler Inhale 2 puffs into the lungs every 6 (six) hours as needed for wheezing or shortness of breath 12/09/21  Yes Iloabachie, Chioma E, NP  amLODipine (NORVASC) 5 MG tablet Take 1 tablet (5 mg total) by mouth once daily. 12/09/21  Yes Iloabachie, Chioma E, NP  cetirizine (ZYRTEC) 10 MG tablet Take 1/2 tablet (5 mg total) by mouth once daily. 12/09/21  Yes Iloabachie, Chioma E, NP  fluticasone furoate-vilanterol (BREO ELLIPTA) 200-25 MCG/ACT AEPB Inhale one puff into the lungs daily 12/15/21  Yes Iloabachie, Chioma E, NP  rosuvastatin (CRESTOR) 5 MG tablet Take 1 tablet (5 mg total) by mouth once daily. 12/09/21  Yes Iloabachie, Chioma E, NP  acetaminophen (TYLENOL) 325 MG tablet Take 650 mg by mouth as needed for mild pain.    [provider]  fluticasone-salmeterol (WIXELA INHUB) 100-50 MCG/ACT AEPB Inhale 1 puff into the lungs 2 (two) times daily. (Use until [facility] available) Patient not taking: Reported on 06/01/2022 12/10/21   Rolm Gala, NP  witch hazel-glycerin (TUCKS) pad Apply 1 application topically as needed for itching. Patient not taking: Reported on 06/01/2022 03/19/21   Eulogio Bear E, NP    Allergies as of 05/18/2022   (No Known Allergies)     Family History  Problem Relation Age of Onset   Hyperlipidemia Mother    Hypertension Mother    Thyroid disease Mother    Bone cancer Father    COPD Sister    COPD Sister    Restless legs syndrome Sister    CAD Maternal Grandmother    Diabetes Maternal Grandfather     Social History   Socioeconomic History   Marital status: Divorced    Spouse name: Not on file   Number of children: Not on file   Years of education: Not on file   Highest education level: Not on file  Occupational History   Not on file  Tobacco Use   Smoking status: Every Day    Packs/day: 0.75    Years: 42.00    Total pack years: 31.50    Types: Cigarettes   Smokeless tobacco: Never   Tobacco comments:    "i am thinking about it" decreased from 1 ppd to 3/4 ppd  Vaping Use   Vaping Use: Never used  Substance and Sexual Activity   Alcohol use: Yes    Alcohol/week: 12.0 standard drinks of alcohol    Types: 12 Cans of beer per week    Comment: 1-2 every other day; 3-4 on weekends   Drug use: No   Sexual activity: Yes    Birth control/protection: Post-menopausal  Other Topics Concern   Not on file  Social History Narrative   Not  on file   Social Determinants of Health   Financial Resource Strain: Not on file  Food Insecurity: No Food Insecurity (12/23/2021)   Hunger Vital Sign    Worried About Running Out of Food in the Last Year: Never true    Ran Out of Food in the Last Year: Never true  Transportation Needs: No Transportation Needs (12/23/2021)   PRAPARE - Administrator, Civil Service (Medical): No    Lack of Transportation (Non-Medical): No  Physical Activity: Not on file  Stress: Not on file  Social Connections: Not on file  Intimate Partner Violence: Not on file    Review of Systems: See HPI, otherwise negative ROS  Physical Exam: BP (!) 149/89   Pulse 98   Temp (!) 97.2 F (36.2 C) (Temporal)   Ht 5' 4.5" (1.638 m)   Wt 52.3 kg   SpO2 97%   BMI 19.47 kg/m   General:   Alert,  pleasant and cooperative in NAD Head:  Normocephalic and atraumatic. Neck:  Supple; no masses or thyromegaly. Lungs:  Clear throughout to auscultation.    Heart:  Regular rate and rhythm. Abdomen:  Soft, nontender and nondistended. Normal bowel sounds, without guarding, and without rebound.   Neurologic:  Alert and  oriented x4;  grossly normal neurologically.  Impression/Plan: Alexis Parks is now here to undergo a screening colonoscopy.  Risks, benefits, and alternatives regarding colonoscopy have been reviewed with the patient.  Questions have been answered.  All parties agreeable.

## 2022-06-01 NOTE — Transfer of Care (Signed)
Immediate Anesthesia Transfer of Care Note  Patient: Alexis Parks  Procedure(s) Performed: COLONOSCOPY WITH PROPOFOL  Patient Location: Endoscopy Unit  Anesthesia Type:General  Level of Consciousness: drowsy  Airway & Oxygen Therapy: Patient Spontanous Breathing  Post-op Assessment: Report given to RN and Post -op Vital signs reviewed and stable  Post vital signs: Reviewed and stable  Last Vitals:  Vitals Value Taken Time  BP 149/86 06/01/22 1117  Temp    Pulse 99 06/01/22 1117  Resp 23 06/01/22 1117  SpO2 100 % 06/01/22 1117  Vitals shown include unvalidated device data.  Last Pain:  Vitals:   06/01/22 0949  TempSrc: Temporal  PainSc: 0-No pain         Complications: No notable events documented.

## 2022-06-02 ENCOUNTER — Encounter: Payer: Self-pay | Admitting: Gastroenterology

## 2022-06-02 LAB — SURGICAL PATHOLOGY

## 2022-06-02 NOTE — Anesthesia Postprocedure Evaluation (Signed)
Anesthesia Post Note  Patient: Alexis Parks  Procedure(s) Performed: COLONOSCOPY WITH PROPOFOL  Patient location during evaluation: Endoscopy Anesthesia Type: General Level of consciousness: awake and alert Pain management: pain level controlled Vital Signs Assessment: post-procedure vital signs reviewed and stable Respiratory status: spontaneous breathing, nonlabored ventilation and respiratory function stable Cardiovascular status: blood pressure returned to baseline and stable Postop Assessment: no apparent nausea or vomiting Anesthetic complications: no   No notable events documented.   Last Vitals:  Vitals:   06/01/22 1117 06/01/22 1127  BP:  (!) 145/85  Pulse:    Temp: (!) 36.2 C   SpO2:      Last Pain:  Vitals:   06/01/22 1137  TempSrc:   PainSc: 0-No pain                 Foye Deer

## 2022-06-03 ENCOUNTER — Encounter: Payer: Self-pay | Admitting: Gastroenterology

## 2022-07-02 ENCOUNTER — Other Ambulatory Visit (HOSPITAL_COMMUNITY): Payer: Self-pay

## 2022-07-13 ENCOUNTER — Other Ambulatory Visit (HOSPITAL_COMMUNITY): Payer: Self-pay

## 2022-07-14 ENCOUNTER — Other Ambulatory Visit (HOSPITAL_COMMUNITY): Payer: Self-pay

## 2022-07-24 ENCOUNTER — Other Ambulatory Visit (HOSPITAL_COMMUNITY): Payer: Self-pay

## 2022-07-29 ENCOUNTER — Other Ambulatory Visit (HOSPITAL_COMMUNITY): Payer: Self-pay

## 2022-07-30 ENCOUNTER — Other Ambulatory Visit (HOSPITAL_COMMUNITY): Payer: Self-pay

## 2022-08-04 ENCOUNTER — Other Ambulatory Visit (HOSPITAL_COMMUNITY): Payer: Self-pay

## 2022-08-06 ENCOUNTER — Other Ambulatory Visit (HOSPITAL_COMMUNITY): Payer: Self-pay

## 2022-08-09 ENCOUNTER — Other Ambulatory Visit (HOSPITAL_COMMUNITY): Payer: Self-pay

## 2022-08-26 ENCOUNTER — Ambulatory Visit: Payer: Self-pay | Admitting: Gerontology

## 2022-09-22 ENCOUNTER — Encounter: Payer: Self-pay | Admitting: Internal Medicine

## 2022-11-10 ENCOUNTER — Other Ambulatory Visit: Payer: Self-pay

## 2023-01-21 ENCOUNTER — Other Ambulatory Visit: Payer: Self-pay

## 2023-01-30 ENCOUNTER — Emergency Department
Admission: EM | Admit: 2023-01-30 | Discharge: 2023-01-30 | Disposition: A | Discharge status: Home / Self Care | Payer: Self-pay | Attending: Emergency Medicine | Admitting: Emergency Medicine

## 2023-01-30 ENCOUNTER — Emergency Department: Payer: Self-pay

## 2023-01-30 DIAGNOSIS — M542 Cervicalgia: Secondary | ICD-10-CM | POA: Insufficient documentation

## 2023-01-30 DIAGNOSIS — S0083XA Contusion of other part of head, initial encounter: Secondary | ICD-10-CM | POA: Insufficient documentation

## 2023-01-30 DIAGNOSIS — W208XXA Other cause of strike by thrown, projected or falling object, initial encounter: Secondary | ICD-10-CM | POA: Insufficient documentation
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
## 2023-02-04 ENCOUNTER — Ambulatory Visit: Payer: 59

## 2023-07-08 ENCOUNTER — Other Ambulatory Visit: Payer: Self-pay

## 2023-08-03 ENCOUNTER — Telehealth: Payer: Self-pay

## 2023-08-03 NOTE — Telephone Encounter (Signed)
Received medical record request for pt for the dates of 02/2022 to present. There are no records for these dates. Faxed back to disability.

## 2023-08-10 ENCOUNTER — Emergency Department: Payer: Self-pay

## 2023-08-10 ENCOUNTER — Emergency Department
Admission: EM | Admit: 2023-08-10 | Discharge: 2023-08-10 | Discharge status: Against Medical Advice | Payer: Self-pay | Attending: Emergency Medicine | Admitting: Emergency Medicine

## 2023-08-10 ENCOUNTER — Encounter: Payer: Self-pay | Admitting: Emergency Medicine

## 2023-08-10 ENCOUNTER — Other Ambulatory Visit: Payer: Self-pay

## 2023-08-10 DIAGNOSIS — Z5321 Procedure and treatment not carried out due to patient leaving prior to being seen by health care provider: Secondary | ICD-10-CM | POA: Insufficient documentation

## 2023-08-10 DIAGNOSIS — R059 Cough, unspecified: Secondary | ICD-10-CM | POA: Insufficient documentation

## 2023-08-10 DIAGNOSIS — R0602 Shortness of breath: Secondary | ICD-10-CM | POA: Insufficient documentation

## 2023-08-10 LAB — BASIC METABOLIC PANEL
Anion gap: 11 (ref 5–15)
BUN: 7 mg/dL (ref 6–20)
CO2: 22 mmol/L (ref 22–32)
Calcium: 9.1 mg/dL (ref 8.9–10.3)
Chloride: 96 mmol/L — ABNORMAL LOW (ref 98–111)
Creatinine, Ser: 0.55 mg/dL (ref 0.44–1.00)
GFR, Estimated: >60 mL/min (ref >60)
Glucose, Bld: 124 mg/dL — ABNORMAL HIGH (ref 70–99)
Potassium: 3.3 mmol/L — ABNORMAL LOW (ref 3.5–5.1)
Sodium: 129 mmol/L — ABNORMAL LOW (ref 135–145)

## 2023-08-10 LAB — CBC
HCT: 43 % (ref 36.0–46.0)
Hemoglobin: 15.5 g/dL — ABNORMAL HIGH (ref 12.0–15.0)
MCH: 34.7 pg — ABNORMAL HIGH (ref 26.0–34.0)
MCHC: 36 g/dL (ref 30.0–36.0)
MCV: 96.2 fL (ref 80.0–100.0)
Platelets: 279 10*3/uL (ref 150–400)
RBC: 4.47 MIL/uL (ref 3.87–5.11)
RDW: 12.4 % (ref 11.5–15.5)
WBC: 10.9 10*3/uL — ABNORMAL HIGH (ref 4.0–10.5)
nRBC: 0 % (ref 0.0–0.2)

## 2023-08-10 NOTE — ED Notes (Signed)
Note on first desk stating that pt left.

## 2023-08-10 NOTE — ED Triage Notes (Signed)
Patient to ED via POV for SOB x5 days. States she thought she had a sinus infection but woke up worse today. Speaking in full sentences. Has productive cough.   89% on RA in triage- placed on 2L [place], now 93%

## 2023-08-10 NOTE — ED Notes (Signed)
Pt called for vital signs, pt not visualized in the lobby

## 2023-08-11 ENCOUNTER — Telehealth: Payer: Self-pay | Admitting: Emergency Medicine

## 2023-08-11 NOTE — Telephone Encounter (Signed)
Ms. Bielen called me back.  She says she is going to [facility].  Says she has to do the paper work and send it in. Says she will do that soon. She has an inhaler from a friend because hers was gone.  I told her that she can always return here int he mean time, especially if she is feeling bad.  I explained that she needs to see a doctor because her labs are abnormal (electrolytes) and her oxygen was low when she was here.  She says she will return as needed.

## 2023-08-11 NOTE — Telephone Encounter (Signed)
Called patient due to left emergency department before provider exam to inquire about condition and follow up plans. Left message. 

## 2023-09-06 IMAGING — CT CT CHEST LUNG CANCER SCREENING LOW DOSE W/O CM
1 of 3 series · 14 of 32 positions shown, 18 images · non-contrast
Comparison: Low-dose lung cancer screening CT chest dated
08/19/2020

CLINICAL DATA: The 11-year-old female current smoker, with 44
pack-year history of smoking, for follow-up lung cancer screening



[Series 4: chest 1mm/super d · axial · 0.65mm/px · z∈[-52,+203]mm · 14 of 377 slices shown, 18 images]
[im 29/377  mediastinal]
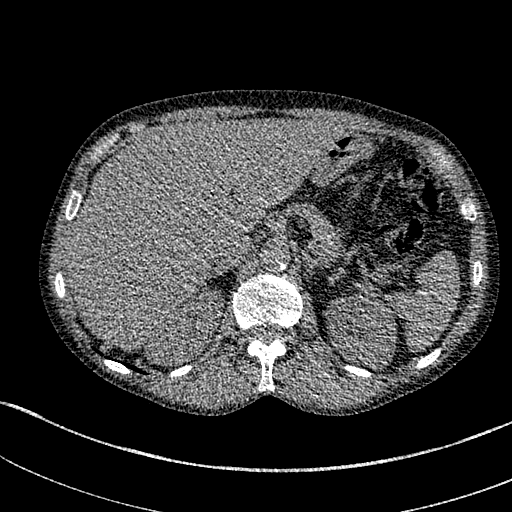
[im 29/377  lung]
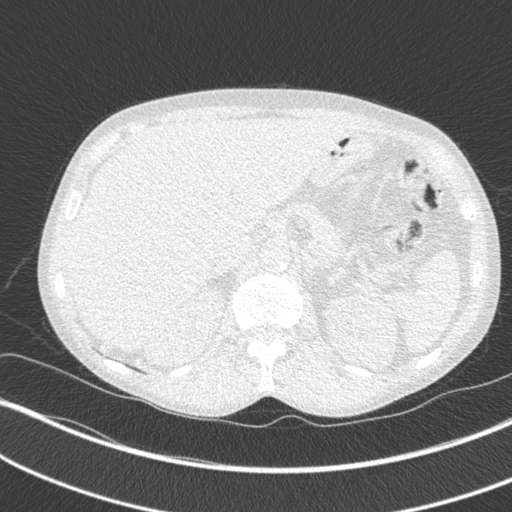
[im 58/377  lung]
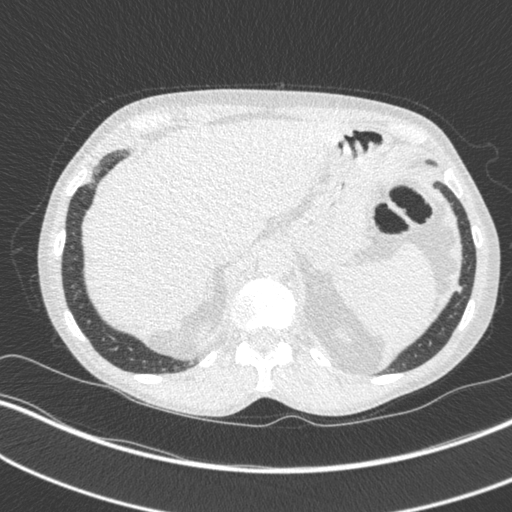
[im 87/377  lung]
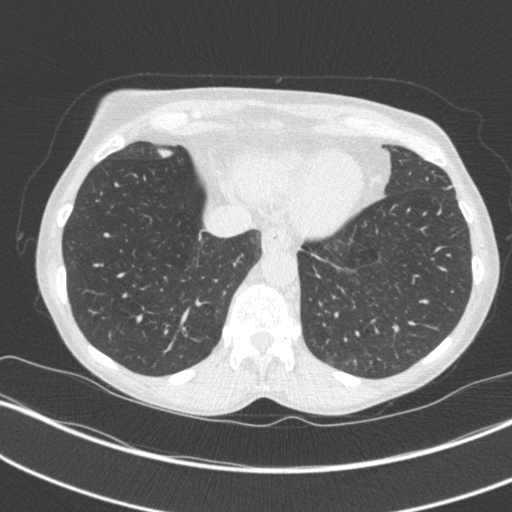
[im 116/377  lung]
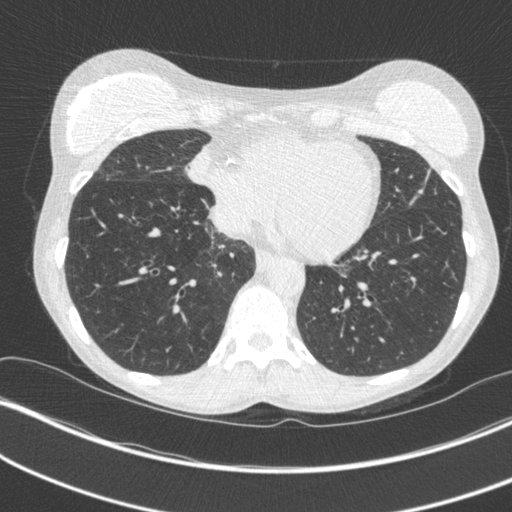
[im 145/377  mediastinal]
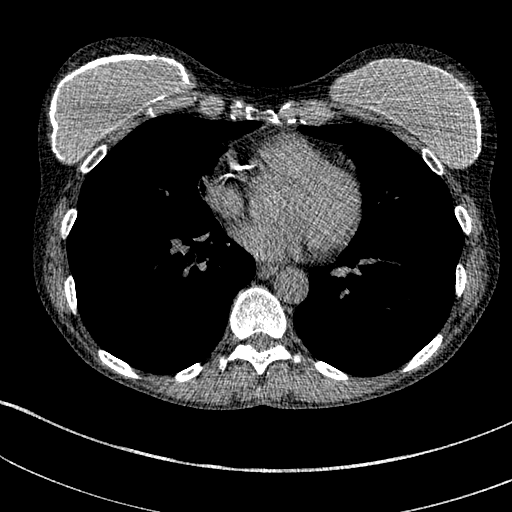
[im 145/377  lung]
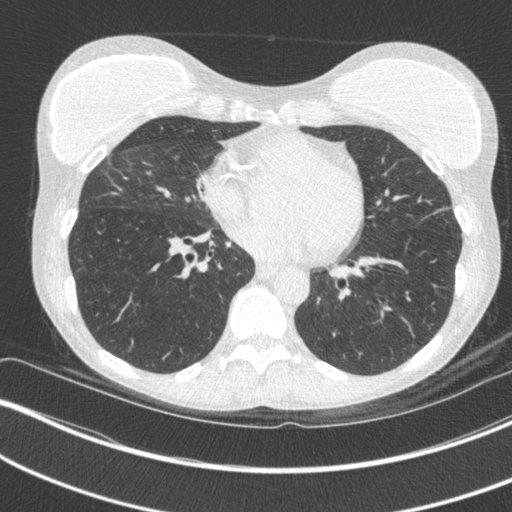
[im 174/377  lung]
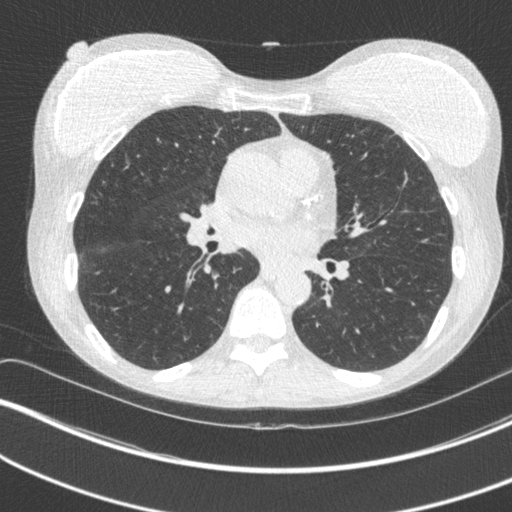
[im 177/377  lung]
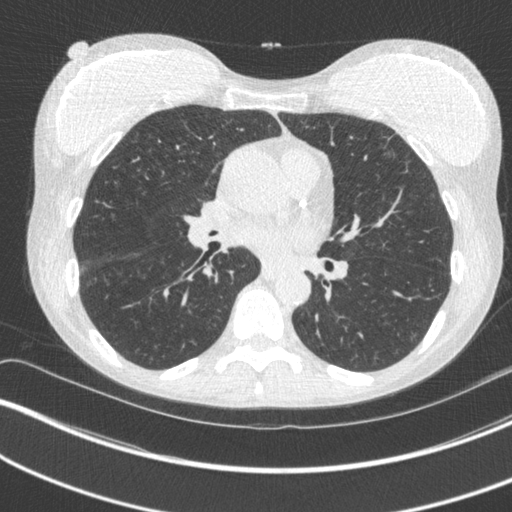
[im 189/377  lung]
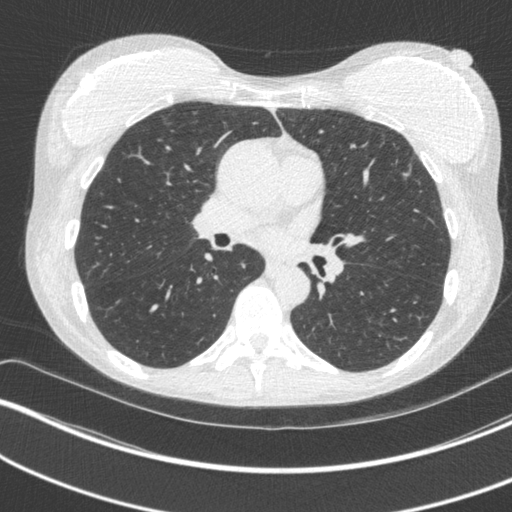
[im 203/377  mediastinal]
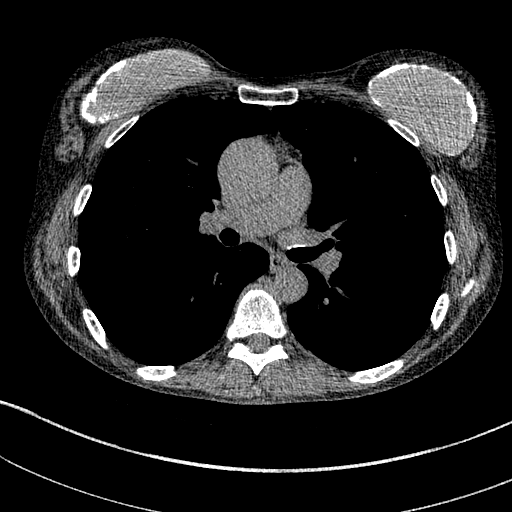
[im 203/377  lung]
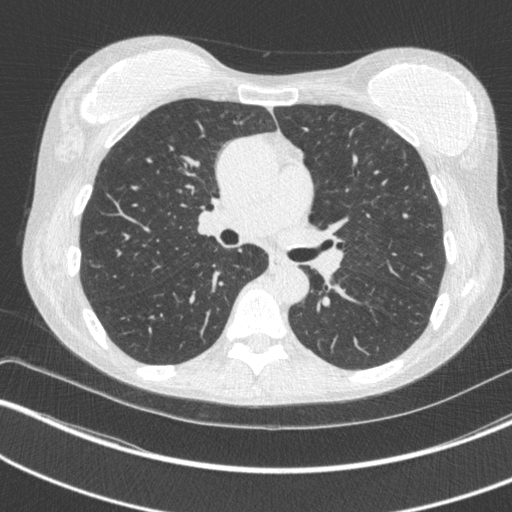
[im 232/377  lung]
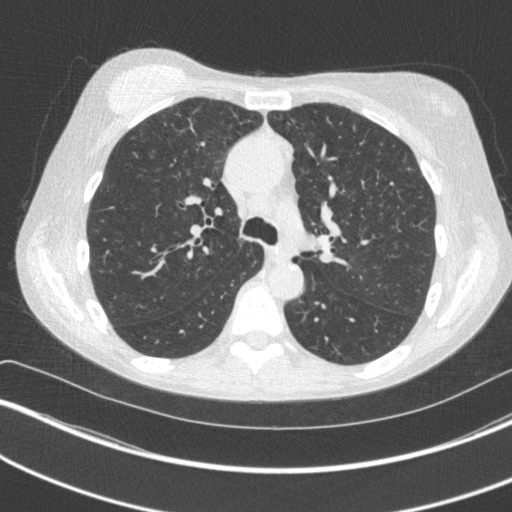
[im 261/377  lung]
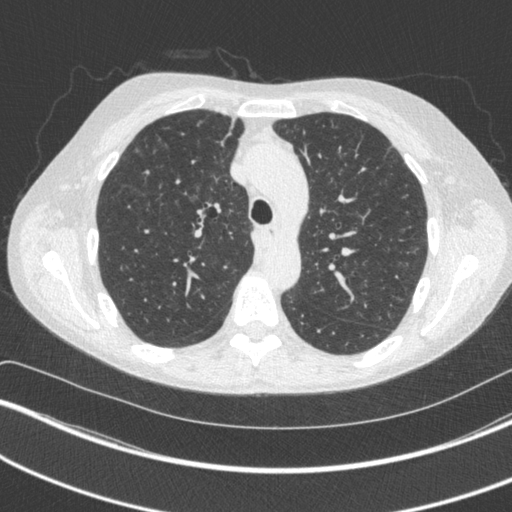
[im 290/377  lung]
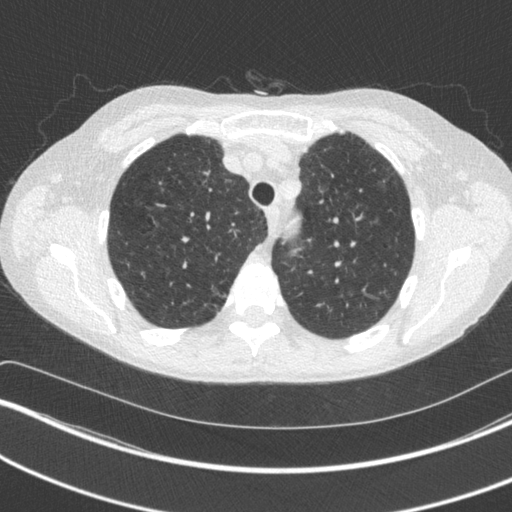
[im 319/377  mediastinal]
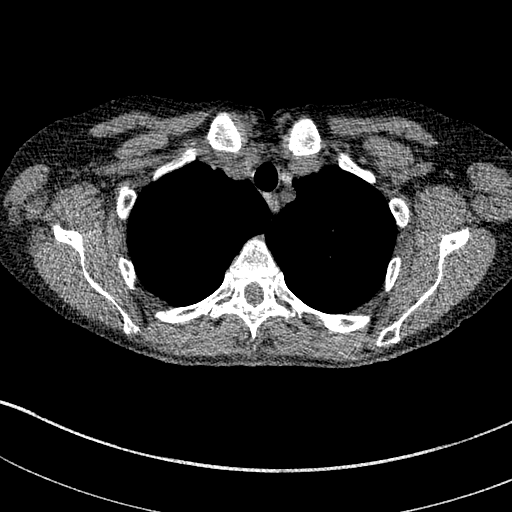
[im 319/377  lung]
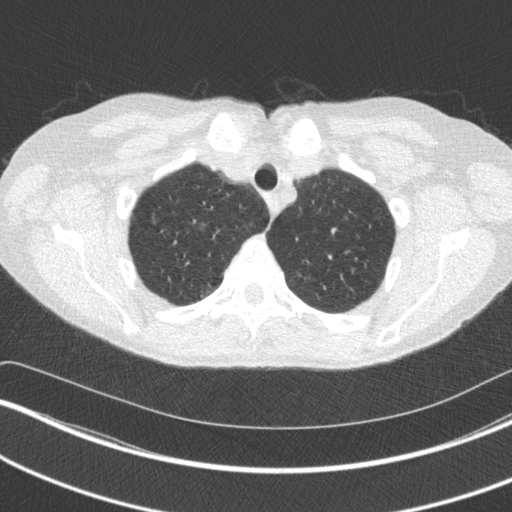
[im 348/377  lung]
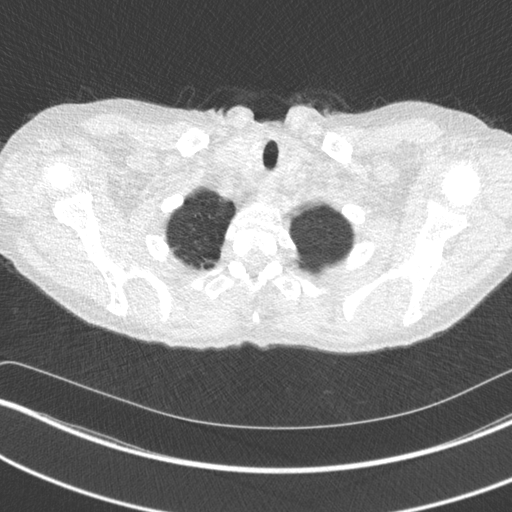

[14 of 32 positions shown; findings below may reference images not displayed]

FINDINGS: Cardiovascular: The heart is normal in size. No pericardial
effusion.

Ascending thoracic aorta measures 3.9 cm. Mild atherosclerotic
calcifications of the descending thoracic aorta.

Coronary atherosclerosis of the LAD and right coronary artery.

Mediastinum/Nodes: No suspicious mediastinal lymphadenopathy.

Visualized thyroid is unremarkable.

Lungs/Pleura: Mild biapical pleural-parenchymal scarring.

Mild interlobular and paraseptal emphysematous changes, upper lung
predominant.

No focal consolidation. Mild scarring/atelectasis in the medial
right middle lobe. Mild lingular scarring.

Scattered small bilateral pulmonary nodules, measuring up to 5.4 mm
in the lateral left lung base, unchanged.

No pleural effusion or pneumothorax.

Upper Abdomen: Visualized upper abdomen is grossly unremarkable,
noting vascular calcifications.

Musculoskeletal: Bilateral breast augmentation. Mild degenerative
changes of the mid thoracic spine.
IMPRESSION: Lung-RADS 2, benign appearance or behavior.

Aortic Atherosclerosis (ABM7R-7XO.O) and Emphysema (ABM7R-HWW.V).

## 2023-09-16 ENCOUNTER — Other Ambulatory Visit: Payer: Self-pay

## 2024-03-19 ENCOUNTER — Telehealth: Payer: Self-pay | Admitting: Family Medicine

## 2024-03-19 ENCOUNTER — Encounter: Payer: Self-pay | Admitting: Family Medicine

## 2024-03-19 ENCOUNTER — Ambulatory Visit: Admitting: Family Medicine

## 2024-03-19 VITALS — BP 101/70 | HR 104 | Resp 16 | Ht 63.5 in | Wt 111.0 lb

## 2024-03-19 DIAGNOSIS — J439 Emphysema, unspecified: Secondary | ICD-10-CM | POA: Diagnosis not present

## 2024-03-19 DIAGNOSIS — J4540 Moderate persistent asthma, uncomplicated: Secondary | ICD-10-CM

## 2024-03-19 DIAGNOSIS — D582 Other hemoglobinopathies: Secondary | ICD-10-CM | POA: Insufficient documentation

## 2024-03-19 DIAGNOSIS — H538 Other visual disturbances: Secondary | ICD-10-CM

## 2024-03-19 DIAGNOSIS — M5431 Sciatica, right side: Secondary | ICD-10-CM

## 2024-03-19 DIAGNOSIS — F17200 Nicotine dependence, unspecified, uncomplicated: Secondary | ICD-10-CM

## 2024-03-19 DIAGNOSIS — Z7689 Persons encountering health services in other specified circumstances: Secondary | ICD-10-CM

## 2024-03-19 DIAGNOSIS — M79644 Pain in right finger(s): Secondary | ICD-10-CM

## 2024-03-19 DIAGNOSIS — J302 Other seasonal allergic rhinitis: Secondary | ICD-10-CM

## 2024-03-19 DIAGNOSIS — I700 Atherosclerosis of aorta: Secondary | ICD-10-CM

## 2024-03-19 DIAGNOSIS — Z1231 Encounter for screening mammogram for malignant neoplasm of breast: Secondary | ICD-10-CM

## 2024-03-19 DIAGNOSIS — Z716 Tobacco abuse counseling: Secondary | ICD-10-CM

## 2024-03-19 MED ORDER — FLUTICASONE FUROATE-VILANTEROL 200-25 MCG/ACT IN AEPB: INHALATION_SPRAY | RESPIRATORY_TRACT | 3 refills | Status: DC

## 2024-03-19 MED ORDER — PREDNISONE 20 MG PO TABS: ORAL_TABLET | ORAL | 0 refills | Status: DC

## 2024-03-19 MED ORDER — ALBUTEROL SULFATE HFA 108 (90 BASE) MCG/ACT IN AERS: 2.0000 | INHALATION_SPRAY | Freq: Four times a day (QID) | RESPIRATORY_TRACT | 3 refills | Status: AC | PRN

## 2024-03-19 NOTE — Telephone Encounter (Signed)
 Flutic/Vilan 200-25 mcg oral Inhalation is not covered by her insurance.  Dulera, Advair  HFAA ER and Advair  Diskua ER  are the covered ones.

## 2024-03-19 NOTE — Patient Instructions (Addendum)
 Please call the [facility] [phone]) to schedule a routine screening mammogram.  Contact [facility] to schedule your colonoscopy.  [address], [place], [place] [address]   Phone: [phone]  Contact [facility] to follow up for lung cancer screening [address], [place], [place] [address] Phone: [phone]

## 2024-03-19 NOTE — Progress Notes (Signed)
 New patient visit   Patient: Alexis Parks   DOB: 1961/01/17   63 y.o. Female  MRN: [id number] Visit Date: 03/19/2024  Today's healthcare provider: LAURAINE LOISE BUOY, DO   Chief Complaint  Patient presents with   Establish Care    [facility] Last mammogram: 2 years ago Last colonoscopy: 1 year ago Last pap smear: 4 years ago   Subjective    Alexis Parks is a 62 y.o. female who presents today as a new patient to establish care.  HPI HPI     Establish Care    Additional comments: [facility] Last mammogram: 2 years ago Last colonoscopy: 1 year ago Last pap smear: 4 years ago      Last edited by Wilfred Hargis RAMAN, CMA on 03/19/2024  3:22 PM.      Alexis Parks is a 63 year old female with COPD and hypertension who presents to establish care after a two-year gap in medical follow-up.  She has not seen a healthcare provider for two years and previously attended the [facility]. She has discontinued all her medications, including those for hypertension, as her blood pressure has been stable. She recalls a past episode of panic that caused a temporary spike in blood pressure.  She experiences daily dyspnea, which she attributes to weather changes and possible allergies. She has not used her prescribed inhalers regularly and has not had Breo Ellipta  for a long time. She has been smoking since age 39 and has not attempted to quit. Pulmonary nodules were noted on prior imaging.  She has a history of cervical cancer treated with a procedure that resulted in sterilization at age 28. She has not had a Pap smear since 2018 and is due for one. She also has breast implants that cause discomfort during mammograms.  She reports a family history of cancer and has been screened for lung cancer. She has not had a recent mammogram or colonoscopy.  She experiences significant pain and swelling at the base of her right thumb  for the past three weeks, affecting her daily activities. She also reports worsening vision in one eye, possibly related to past cataract surgery and suspected glaucoma.  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 She has a history of scoliosis and experiences nerve pain and arthritis symptoms in her wrist.  She is married and lives with her husband; they do not have children due to the previously mentioned procedure.     Past Medical History:  Diagnosis Date   Allergy    seasonal   Asthma    Cellulitis 12/23/2021   Hyperlipidemia    Hypertension    Past Surgical History:  Procedure Laterality Date   AUGMENTATION MAMMAPLASTY Bilateral 25+ yrs ago   silicone   COLONOSCOPY WITH PROPOFOL  N/A 06/01/2022   Procedure: COLONOSCOPY WITH PROPOFOL ;  Surgeon: Jinny Carmine, MD;  Location: [facility] ENDOSCOPY;  Service: Endoscopy;  Laterality: N/A;   EYE SURGERY     cataract surgery   Family Status  Relation Name Status   Mother  Alive   Father  Deceased   Sister  Alive   Sister  Alive   MGM  Deceased   MGF  Deceased  No partnership data on file   Family History  Problem Relation Age of Onset   Hyperlipidemia Mother    Hypertension Mother    Thyroid disease Mother  Bone cancer Father    COPD Sister    COPD Sister    Restless legs syndrome Sister    CAD Maternal Grandmother    Diabetes Maternal Grandfather    Social History   Socioeconomic History   Marital status: Divorced    Spouse name: Not on file   Number of children: Not on file   Years of education: Not on file   Highest education level: Not on file  Occupational History   Not on file  Tobacco Use   Smoking status: Every Day    Current packs/day: 0.75    Average packs/day: 0.8 packs/day for 42.0 years (31.5 ttl pk-yrs)    Types: Cigarettes   Smokeless tobacco: Never   Tobacco comments:    i am thinking about it decreased from 1 ppd to 3/4 ppd  Vaping Use   Vaping status:  Never Used  Substance and Sexual Activity   Alcohol use: Yes    Alcohol/week: 11.0 standard drinks of alcohol    Types: 3 Glasses of wine, 2 Cans of beer, 6 Shots of liquor per week    Comment: 1-2 every other day; 3-4 on weekends   Drug use: No   Sexual activity: Yes    Birth control/protection: Post-menopausal  Other Topics Concern   Not on file  Social History Narrative   Not on file   Social Drivers of Health   Financial Resource Strain: Low Risk  (03/19/2024)   Overall Financial Resource Strain (CARDIA)    Difficulty of Paying Living Expenses: Not hard at all  Food Insecurity: No Food Insecurity (03/19/2024)   Hunger Vital Sign    Worried About Running Out of Food in the Last Year: Never true    Ran Out of Food in the Last Year: Never true  Transportation Needs: No Transportation Needs (03/19/2024)   PRAPARE - Administrator, Civil Service (Medical): No    Lack of Transportation (Non-Medical): No  Physical Activity: Sufficiently Active (03/19/2024)   Exercise Vital Sign    Days of Exercise per Week: 3 days    Minutes of Exercise per Session: 70 min  Stress: No Stress Concern Present (03/19/2024)   [employer] of Occupational Health - Occupational Stress Questionnaire    Feeling of Stress: Not at all  Social Connections: Not on file   Outpatient Medications Prior to Visit  Medication Sig   cetirizine  (ZYRTEC ) 10 MG tablet Take 1/2 tablet (5 mg total) by mouth once daily. (Patient not taking: Reported on 03/19/2024)   [DISCONTINUED] acetaminophen  (TYLENOL ) 325 MG tablet Take 650 mg by mouth as needed for mild pain. (Patient not taking: Reported on 03/19/2024)   [DISCONTINUED] albuterol  (PROVENTIL  HFA) 108 (90 Base) MCG/ACT inhaler Inhale 2 puffs into the lungs every 6 (six) hours as needed for wheezing or shortness of breath (Patient not taking: Reported on 03/19/2024)   [DISCONTINUED] amLODipine  (NORVASC ) 5 MG tablet Take 1 tablet (5 mg total) by mouth once  daily. (Patient not taking: Reported on 03/19/2024)   [DISCONTINUED] fluticasone  furoate-vilanterol (BREO ELLIPTA ) 200-25 MCG/ACT AEPB Inhale one puff into the lungs daily (Patient not taking: Reported on 03/19/2024)   [DISCONTINUED] fluticasone -salmeterol (WIXELA INHUB ) 100-50 MCG/ACT AEPB Inhale 1 puff into the lungs 2 (two) times daily. (Use until [facility] available) (Patient not taking: Reported on 03/19/2024)   [DISCONTINUED] rosuvastatin  (CRESTOR ) 5 MG tablet Take 1 tablet (5 mg total) by mouth once daily. (Patient not taking: Reported on 03/19/2024)   [DISCONTINUED] witch hazel-glycerin  (TUCKS)  pad Apply 1 application topically as needed for itching. (Patient not taking: Reported on 03/19/2024)   No facility-administered medications prior to visit.   No Known Allergies  Immunization History  Administered Date(s) Administered   Influenza,inj,Quad PF,6+ Mos 07/09/2020   Influenza-Unspecified 09/20/2020   Moderna Sars-Covid-2 Vaccination 09/21/2019, 12/20/2019   Tdap 05/26/2014    Health Maintenance  Topic Date Due   HIV Screening  Never done   Hepatitis C Screening  Never done   Cervical Cancer Screening (HPV/Pap Cotest)  10/13/2021   MAMMOGRAM  01/01/2023   Lung Cancer Screening  02/03/2023   Colonoscopy  06/02/2023   Pneumococcal Vaccine 57-31 Years old (1 of 2 - PCV) 04/09/2024 (Originally 10/01/1981)   Zoster Vaccines- Shingrix (1 of 2) 04/09/2024 (Originally 10/01/2012)   COVID-19 Vaccine (3 - 2024-25 season) 06/20/2024 (Originally 05/22/2023)   INFLUENZA VACCINE  04/20/2024   DTaP/Tdap/Td (2 - Td or Tdap) 05/26/2024   Hepatitis B Vaccines  Aged Out   HPV VACCINES  Aged Out   Meningococcal B Vaccine  Aged Out    Patient Care Team: Ashna Dorough, Lauraine SAILOR, DO as PCP - General (Family Medicine) Cindie Jesusa HERO, RN as Registered Nurse Dannielle Arlean FALCON, RN (Inactive) as Registered Nurse Dellie Louanne MATSU, MD (General Surgery)       Objective    BP 101/70 (BP Location: Left Arm,  Patient Position: Sitting, Cuff Size: Normal)   Pulse (!) 104   Resp 16   Ht 5' 3.5 (1.613 m)   Wt 111 lb (50.3 kg)   SpO2 94%   BMI 19.35 kg/m     Physical Exam Constitutional:      Appearance: Normal appearance.  HENT:     Head: Normocephalic and atraumatic.   Eyes:     General: No scleral icterus.    Extraocular Movements: Extraocular movements intact.     Conjunctiva/sclera: Conjunctivae normal.    Cardiovascular:     Rate and Rhythm: Normal rate and regular rhythm.     Pulses: Normal pulses.     Heart sounds: Normal heart sounds.  Pulmonary:     Effort: Pulmonary effort is normal. No respiratory distress.     Breath sounds: Normal breath sounds.  Abdominal:     General: Bowel sounds are normal. There is no distension.     Palpations: Abdomen is soft. There is no mass.     Tenderness: There is no abdominal tenderness. There is no guarding.   Musculoskeletal:     Right lower leg: No edema.     Left lower leg: No edema.   Skin:    General: Skin is warm and dry.   Neurological:     Mental Status: She is alert and oriented to person, place, and time. Mental status is at baseline.   Psychiatric:        Mood and Affect: Mood normal.        Behavior: Behavior normal.     Depression Screen    03/19/2024    3:27 PM 05/14/2022    2:00 PM 12/23/2021    2:05 PM 02/26/2021    3:41 PM  PHQ 2/9 Scores  PHQ - 2 Score 0 0 0 1  PHQ- 9 Score 6      No results found for any visits on 03/19/24.  Assessment & Plan     Encounter to establish care  Elevated hemoglobin (HCC)  Moderate persistent asthma without complication -     Albuterol  Sulfate HFA; Inhale 2 puffs into  the lungs every 6 (six) hours as needed for wheezing or shortness of breath  Dispense: 20.1 g; Refill: 3 -     Fluticasone  Furoate-Vilanterol; Inhale one puff into the lungs daily  Dispense: 180 each; Refill: 3  Thoracic aortic atherosclerosis (HCC)  Pulmonary emphysema, unspecified emphysema type  (HCC) -     Fluticasone  Furoate-Vilanterol; Inhale one puff into the lungs daily  Dispense: 180 each; Refill: 3  Nicotine dependence with current use  Encounter for smoking cessation counseling  Pain of right thumb -     DG Hand Complete Right; Future -     predniSONE ; Take 60mg  PO daily x 2 days, then40mg  PO daily x 2 days, then 20mg  PO daily x 3 days  Dispense: 13 tablet; Refill: 0  Right sciatic nerve pain  Blurry vision, right eye -     Ambulatory referral to Ophthalmology  Encounter for screening mammogram for breast cancer -     3D Screening Mammogram w/Implants, Left and Right; Future  Seasonal allergies      Establish care with a new doctor, encounter for Mammogram and colonoscopy overdue. Has not received shingles vaccine and is hesitant about COVID booster. Discussed importance of screenings and vaccinations. - Send order for mammogram. - Advise to contact for colonoscopy scheduling. - Discuss shingles vaccine; declined today. - Discuss COVID booster; declined further vaccination. - Screen for hepatitis C and HIV.  Elevated hemoglobin History of elevated hemoglobin noted on patient's record.  Will recheck CBC at next visit with blood draw, per patient preference.  Chronic Obstructive Pulmonary Disease (COPD)/emphysema; moderate persistent asthma CT chest in 2023 showed emphysematous changes. Daily dyspnea, exacerbated by weather. Inconsistent inhaler use noted. Emphasized consistent inhaler use to manage symptoms and prevent exacerbations. - Refill albuterol  inhaler. - Send prescription for Breo Ellipta ; if not covered, send prescription for Advair  or Symbicort. - Advise consistent use of inhalers as prescribed.  Pulmonary Nodules Pulmonary nodules present, follow-up with pulmonology required. - Advise to contact pulmonology for follow-up.  Contact information given  Nicotine dependence with current use, smoking cessation counseling Patient prefers to avoid  medications to facilitate smoking cessation.  She states she is unwilling to quit unless there is an injection to allow her to stop.  Right thumb pain and Swelling Right thumb pain and swelling for three weeks, affecting function. Prescribed prednisone  to reduce inflammation and pain. - Order x-ray of the wrist. - Prescribe prednisone  pack for swelling and pain.  Blurry vision of right eye Reports vision changes. Referral to ophthalmology necessary. - Refer to ophthalmology for evaluation.  Thoracic aortic atherosclerosis Atherosclerosis identified on CT. Previously started on rosuvastatin  5 mg but hesitant to continue without further screening. - Plan to reassess cholesterol levels during next visit. - Discuss potential need for cholesterol medication after lab results.  History of elevated blood pressure without diagnosis of hypertension Blood pressure well within normal limits today while not on medications.  No changes in patient's health status reported.  Suspect prior elevation were been transient, rather than a reflection of true hypertension.  Seasonal allergies Uses Zyrtec  intermittently to control her allergies.  Recommended daily use during high allergy season and as-needed use at other times of the year.  Right sciatic nerve pain Noted but not addressed today due to lack of time.    Follow-up Requires follow-up for multiple health issues and screenings. - Schedule full physical within the next 3-6 months. - Plan to perform blood work at next visit. - Advise to follow  up with imaging center for wrist x-ray.  Return in about 2 months (around 05/19/2024) for CPE, Pap.     There are other unrelated non-urgent complaints, but due to the busy schedule and the amount of time I've already spent with her, time does not permit me to address these routine issues at today's visit. I've requested another appointment to review these additional issues.  I discussed the assessment  and treatment plan with the patient  The patient was provided an opportunity to ask questions and all were answered. The patient agreed with the plan and demonstrated an understanding of the instructions.   The patient was advised to call back or seek an in-person evaluation if the symptoms worsen or if the condition fails to improve as anticipated.    LAURAINE LOISE BUOY, DO  [facility] [phone] (phone) [phone] (fax)  [facility]

## 2024-03-20 ENCOUNTER — Telehealth: Payer: Self-pay | Admitting: Family Medicine

## 2024-03-20 NOTE — Telephone Encounter (Signed)
 [facility] pharmacy is requesting refill fluticasone  furoate-vilanterol (BREO ELLIPTA ) 200-25 MCG/ACT AEPB   Please advise

## 2024-03-20 NOTE — Telephone Encounter (Signed)
 Refilled during visit on 03/19/24

## 2024-03-25 MED ORDER — FLUTICASONE-SALMETEROL 100-50 MCG/ACT IN AEPB: 1.0000 | INHALATION_SPRAY | Freq: Two times a day (BID) | RESPIRATORY_TRACT | 11 refills | Status: AC

## 2024-03-25 NOTE — Addendum Note (Signed)
 Addended by: DONZELLA DOMINO on: 03/25/2024 02:12 PM   Modules accepted: Orders

## 2024-03-27 ENCOUNTER — Other Ambulatory Visit: Payer: Self-pay

## 2024-03-27 DIAGNOSIS — Z860100 Personal history of colon polyps, unspecified: Secondary | ICD-10-CM

## 2024-03-27 MED ORDER — NA SULFATE-K SULFATE-MG SULF 17.5-3.13-1.6 GM/177ML PO SOLN: 1.0000 | Freq: Once | ORAL | 0 refills | Status: AC

## 2024-04-13 ENCOUNTER — Encounter

## 2024-05-01 ENCOUNTER — Encounter

## 2024-05-29 ENCOUNTER — Ambulatory Visit
Admission: RE | Admit: 2024-05-29 | Discharge: 2024-05-29 | Disposition: A | Discharge status: Home / Self Care | Source: Ambulatory Visit | Attending: Family Medicine | Admitting: Family Medicine

## 2024-05-29 DIAGNOSIS — Z1231 Encounter for screening mammogram for malignant neoplasm of breast: Secondary | ICD-10-CM | POA: Diagnosis present

## 2024-06-04 ENCOUNTER — Ambulatory Visit: Payer: Self-pay | Admitting: Family Medicine

## 2024-06-11 ENCOUNTER — Ambulatory Visit

## 2024-06-11 ENCOUNTER — Other Ambulatory Visit: Payer: Self-pay

## 2024-06-11 ENCOUNTER — Ambulatory Visit
Admission: RE | Admit: 2024-06-11 | Discharge: 2024-06-11 | Disposition: A | Discharge status: Home / Self Care | Attending: Gastroenterology | Admitting: Gastroenterology

## 2024-06-11 ENCOUNTER — Encounter
Admission: RE | Disposition: A | Discharge status: Home / Self Care | Payer: Self-pay | Source: Home / Self Care | Attending: Gastroenterology

## 2024-06-11 ENCOUNTER — Encounter: Payer: Self-pay | Admitting: Gastroenterology

## 2024-06-11 DIAGNOSIS — Z860101 Personal history of adenomatous and serrated colon polyps: Secondary | ICD-10-CM | POA: Diagnosis not present

## 2024-06-11 DIAGNOSIS — E785 Hyperlipidemia, unspecified: Secondary | ICD-10-CM | POA: Insufficient documentation

## 2024-06-11 DIAGNOSIS — Z860100 Personal history of colon polyps, unspecified: Secondary | ICD-10-CM

## 2024-06-11 DIAGNOSIS — F17210 Nicotine dependence, cigarettes, uncomplicated: Secondary | ICD-10-CM | POA: Diagnosis not present

## 2024-06-11 DIAGNOSIS — Z1211 Encounter for screening for malignant neoplasm of colon: Secondary | ICD-10-CM

## 2024-06-11 DIAGNOSIS — J45909 Unspecified asthma, uncomplicated: Secondary | ICD-10-CM | POA: Insufficient documentation

## 2024-06-11 DIAGNOSIS — Z79899 Other long term (current) drug therapy: Secondary | ICD-10-CM | POA: Insufficient documentation

## 2024-06-11 DIAGNOSIS — K635 Polyp of colon: Secondary | ICD-10-CM

## 2024-06-11 DIAGNOSIS — I10 Essential (primary) hypertension: Secondary | ICD-10-CM | POA: Diagnosis not present

## 2024-06-11 DIAGNOSIS — Z7952 Long term (current) use of systemic steroids: Secondary | ICD-10-CM | POA: Diagnosis not present

## 2024-06-11 DIAGNOSIS — D122 Benign neoplasm of ascending colon: Secondary | ICD-10-CM

## 2024-06-11 DIAGNOSIS — Z7951 Long term (current) use of inhaled steroids: Secondary | ICD-10-CM | POA: Diagnosis not present

## 2024-06-11 SURGERY — COLONOSCOPY
Anesthesia: General

## 2024-06-11 MED ORDER — PROPOFOL 10 MG/ML IV BOLUS
INTRAVENOUS | Status: DC | PRN
  Administered 2024-06-11: 30 mg via INTRAVENOUS
  Administered 2024-06-11: 100 mg via INTRAVENOUS
  Administered 2024-06-11: 50 mg via INTRAVENOUS
  Administered 2024-06-11 (×2): 20 mg via INTRAVENOUS
  Administered 2024-06-11: 30 mg via INTRAVENOUS
  Administered 2024-06-11: 50 mg via INTRAVENOUS

## 2024-06-11 MED ORDER — SODIUM CHLORIDE 0.9 % IV SOLN: INTRAVENOUS | Status: DC

## 2024-06-11 MED ORDER — DEXMEDETOMIDINE HCL IN NACL 80 MCG/20ML IV SOLN
INTRAVENOUS | Status: DC | PRN
  Administered 2024-06-11: 4 ug via INTRAVENOUS

## 2024-06-11 MED ORDER — PROPOFOL 1000 MG/100ML IV EMUL
INTRAVENOUS | Status: AC
  Filled 2024-06-11: qty 100

## 2024-06-11 MED ORDER — GLYCOPYRROLATE 0.2 MG/ML IJ SOLN
INTRAMUSCULAR | Status: AC
Start: 2024-06-11 — End: 2024-06-11
  Filled 2024-06-11: qty 1

## 2024-06-11 MED ORDER — GLYCOPYRROLATE 0.2 MG/ML IJ SOLN
INTRAMUSCULAR | Status: DC | PRN
  Administered 2024-06-11: .1 mg via INTRAVENOUS

## 2024-06-11 MED ORDER — LIDOCAINE HCL (CARDIAC) PF 100 MG/5ML IV SOSY
PREFILLED_SYRINGE | INTRAVENOUS | Status: DC | PRN
  Administered 2024-06-11: 50 mg via INTRAVENOUS

## 2024-06-11 MED ORDER — LIDOCAINE HCL (PF) 2 % IJ SOLN
INTRAMUSCULAR | Status: AC
  Filled 2024-06-11: qty 5

## 2024-06-11 NOTE — Op Note (Signed)
 [facility] Patient Name: Alexis Parks Procedure Date: 06/11/2024 7:51 AM MRN: [id number] Account #: [id number] Date of Birth: 04-15-1961 Admit Type: Outpatient Age: 63 Room: [facility] ENDO ROOM 4 Gender: Female Note Status: Finalized Instrument Name: Colon Scope [id number] Procedure:             Colonoscopy Indications:           High risk colon cancer surveillance: Personal history                         of colonic polyps Providers:             Rogelia Copping MD, MD Referring MD:          Lauraine GEANNIE Buoy (Referring MD) Medicines:             Propofol  per Anesthesia Complications:         No immediate complications. Procedure:             Pre-Anesthesia Assessment:                        - Prior to the procedure, a History and Physical was                         performed, and patient medications and allergies were                         reviewed. The patient's tolerance of previous                         anesthesia was also reviewed. The risks and benefits                         of the procedure and the sedation options and risks                         were discussed with the patient. All questions were                         answered, and informed consent was obtained. Prior                         Anticoagulants: The patient has taken no anticoagulant                         or antiplatelet agents. ASA Grade Assessment: II - A                         patient with mild systemic disease. After reviewing                         the risks and benefits, the patient was deemed in                         satisfactory condition to undergo the procedure.                        After obtaining informed consent, the colonoscope was  passed under direct vision. Throughout the procedure,                         the patient's blood pressure, pulse, and oxygen                         saturations were monitored continuously. The                          Colonoscope was introduced through the anus and                         advanced to the the cecum, identified by appendiceal                         orifice and ileocecal valve. The colonoscopy was                         performed without difficulty. The patient tolerated                         the procedure well. The quality of the bowel                         preparation was excellent. Findings:      The perianal and digital rectal examinations were normal.      Three sessile polyps were found in the ascending colon. The polyps were       1 to 2 mm in size. These polyps were removed with a cold biopsy forceps.       Resection and retrieval were complete.      The exam was otherwise without abnormality. Impression:            - Three 1 to 2 mm polyps in the ascending colon,                         removed with a cold biopsy forceps. Resected and                         retrieved.                        - The examination was otherwise normal. Recommendation:        - Discharge patient to home.                        - Resume previous diet.                        - Continue present medications.                        - Await pathology results.                        - Repeat colonoscopy in 5 years for surveillance. Procedure Code(s):     --- Professional ---                        [phone], Colonoscopy, flexible; with biopsy, single or  multiple Diagnosis Code(s):     --- Professional ---                        Z86.010, Personal history of colonic polyps                        D12.2, Benign neoplasm of ascending colon CPT copyright 2022 American Medical Association. All rights reserved. The codes documented in this report are preliminary and upon coder review may  be revised to meet current compliance requirements. Rogelia Copping MD, MD 06/11/2024 8:20:19 AM This report has been signed electronically. Number of Addenda: 0 Note Initiated On:  06/11/2024 7:51 AM Scope Withdrawal Time: 0 hours 9 minutes 21 seconds  Total Procedure Duration: 0 hours 17 minutes 49 seconds  Estimated Blood Loss:  Estimated blood loss: none.      [facility]

## 2024-06-11 NOTE — Anesthesia Preprocedure Evaluation (Signed)
 Anesthesia Evaluation  Patient identified by MRN, date of birth, ID band Patient awake    Reviewed: Allergy & Precautions, H&P , NPO status , Patient's Chart, lab work & pertinent test results, reviewed documented beta blocker date and time   Airway Mallampati: II   Neck ROM: full    Dental  (+) Poor Dentition   Pulmonary asthma , Current Smoker and Patient abstained from smoking.   Pulmonary exam normal        Cardiovascular Exercise Tolerance: Poor hypertension, On Medications negative cardio ROS Normal cardiovascular exam Rhythm:regular Rate:Normal     Neuro/Psych negative neurological ROS  negative psych ROS   GI/Hepatic negative GI ROS, Neg liver ROS,,,  Endo/Other  negative endocrine ROS    Renal/GU negative Renal ROS  negative genitourinary   Musculoskeletal   Abdominal   Peds  Hematology negative hematology ROS (+)   Anesthesia Other Findings Past Medical History: No date: Allergy     Comment:  seasonal No date: Asthma 12/23/2021: Cellulitis No date: Hyperlipidemia No date: Hypertension Past Surgical History: 25+ yrs ago: AUGMENTATION MAMMAPLASTY; Bilateral     Comment:  silicone 06/01/2022: COLONOSCOPY WITH PROPOFOL ; N/A     Comment:  Procedure: COLONOSCOPY WITH PROPOFOL ;  Surgeon: Jinny Carmine, MD;  Location: [facility] ENDOSCOPY;  Service:               Endoscopy;  Laterality: N/A; No date: EYE SURGERY     Comment:  cataract surgery   Reproductive/Obstetrics negative OB ROS                              Anesthesia Physical Anesthesia Plan  ASA: 2  Anesthesia Plan: General   Post-op Pain Management:    Induction:   PONV Risk Score and Plan:   Airway Management Planned:   Additional Equipment:   Intra-op Plan:   Post-operative Plan:   Informed Consent: I have reviewed the patients History and Physical, chart, labs and discussed the procedure  including the risks, benefits and alternatives for the proposed anesthesia with the patient or authorized representative who has indicated his/her understanding and acceptance.     Dental Advisory Given  Plan Discussed with: CRNA  Anesthesia Plan Comments:         Anesthesia Quick Evaluation

## 2024-06-11 NOTE — Transfer of Care (Signed)
 Immediate Anesthesia Transfer of Care Note  Patient: Alexis Parks  Procedure(s) Performed: COLONOSCOPY POLYPECTOMY, INTESTINE  Patient Location: PACU  Anesthesia Type:General  Level of Consciousness: drowsy  Airway & Oxygen Therapy: Patient Spontanous Breathing  Post-op Assessment: Report given to RN and Post -op Vital signs reviewed and stable  Post vital signs: Reviewed and stable  Last Vitals:  Vitals Value Taken Time  BP    Temp    Pulse 95 06/11/24 08:22  Resp 20 06/11/24 08:22  SpO2 99 % 06/11/24 08:22  Vitals shown include unfiled device data.  Last Pain:  Vitals:   06/11/24 0732  TempSrc: Temporal  PainSc: 0-No pain         Complications: No notable events documented.

## 2024-06-11 NOTE — Anesthesia Postprocedure Evaluation (Signed)
 Anesthesia Post Note  Patient: Alexis Parks  Procedure(s) Performed: COLONOSCOPY POLYPECTOMY, INTESTINE  Patient location during evaluation: PACU Anesthesia Type: General Level of consciousness: awake and alert Pain management: pain level controlled Vital Signs Assessment: post-procedure vital signs reviewed and stable Respiratory status: spontaneous breathing, nonlabored ventilation, respiratory function stable and patient connected to nasal cannula oxygen Cardiovascular status: blood pressure returned to baseline and stable Postop Assessment: no apparent nausea or vomiting Anesthetic complications: no   No notable events documented.   Last Vitals:  Vitals:   06/11/24 0833 06/11/24 0848  BP: 134/77 131/87  Pulse: 84 73  Resp: (!) 26 (!) 23  Temp:    SpO2: 100% 99%    Last Pain:  Vitals:   06/11/24 0848  TempSrc:   PainSc: 0-No pain                 Lyn[place] KANDICE Clause

## 2024-06-11 NOTE — H&P (Signed)
 Rogelia Copping, MD [facility] [address], [address] [place], [place] [address] Phone:[phone] Fax : [phone]  Primary Care Physician:  Donzella Lauraine SAILOR, DO Primary Gastroenterologist:  Dr. Copping  Pre-Procedure History & Physical: HPI:  Alexis Parks is a 63 y.o. female is here for an colonoscopy.   Past Medical History:  Diagnosis Date   Allergy    seasonal   Asthma    Cellulitis 12/23/2021   Hyperlipidemia    Hypertension     Past Surgical History:  Procedure Laterality Date   AUGMENTATION MAMMAPLASTY Bilateral 25+ yrs ago   silicone   COLONOSCOPY WITH PROPOFOL  N/A 06/01/2022   Procedure: COLONOSCOPY WITH PROPOFOL ;  Surgeon: Copping Rogelia, MD;  Location: [facility] ENDOSCOPY;  Service: Endoscopy;  Laterality: N/A;   EYE SURGERY     cataract surgery    Prior to Admission medications   Medication Sig Start Date End Date Taking? Authorizing Provider  fluticasone -salmeterol (ADVAIR ) 100-50 MCG/ACT AEPB Inhale 1 puff into the lungs 2 (two) times daily. 03/25/24  Yes Pardue, Lauraine SAILOR, DO  albuterol  (PROVENTIL  HFA) 108 (90 Base) MCG/ACT inhaler Inhale 2 puffs into the lungs every 6 (six) hours as needed for wheezing or shortness of breath 03/19/24   Pardue, Lauraine SAILOR, DO  cetirizine  (ZYRTEC ) 10 MG tablet Take 1/2 tablet (5 mg total) by mouth once daily. Patient not taking: Reported on 03/19/2024 12/09/21   Iloabachie, Chioma E, NP  predniSONE  (DELTASONE ) 20 MG tablet Take 60mg  PO daily x 2 days, then40mg  PO daily x 2 days, then 20mg  PO daily x 3 days Patient not taking: Reported on 06/11/2024 03/19/24   Donzella Lauraine SAILOR, DO    Allergies as of 03/27/2024   (No Known Allergies)    Family History  Problem Relation Age of Onset   Hyperlipidemia Mother    Hypertension Mother    Thyroid disease Mother    Bone cancer Father    COPD Sister    COPD Sister    Restless legs syndrome Sister    CAD Maternal Grandmother    Diabetes Maternal Grandfather    Breast cancer Neg Hx     Social  History   Socioeconomic History   Marital status: Divorced    Spouse name: Not on file   Number of children: Not on file   Years of education: Not on file   Highest education level: Not on file  Occupational History   Not on file  Tobacco Use   Smoking status: Every Day    Current packs/day: 0.75    Average packs/day: 0.8 packs/day for 42.0 years (31.5 ttl pk-yrs)    Types: Cigarettes   Smokeless tobacco: Never   Tobacco comments:    i am thinking about it decreased from 1 ppd to 3/4 ppd  Vaping Use   Vaping status: Never Used  Substance and Sexual Activity   Alcohol use: Yes    Alcohol/week: 11.0 standard drinks of alcohol    Types: 3 Glasses of wine, 2 Cans of beer, 6 Shots of liquor per week    Comment: 1-2 every other day; 3-4 on weekends   Drug use: No   Sexual activity: Yes    Birth control/protection: Post-menopausal  Other Topics Concern   Not on file  Social History Narrative   Not on file   Social Drivers of Health   Financial Resource Strain: Low Risk  (03/19/2024)   Overall Financial Resource Strain (CARDIA)    Difficulty of Paying Living Expenses: Not hard  at all  Food Insecurity: No Food Insecurity (03/19/2024)   Hunger Vital Sign    Worried About Running Out of Food in the Last Year: Never true    Ran Out of Food in the Last Year: Never true  Transportation Needs: No Transportation Needs (03/19/2024)   PRAPARE - Administrator, Civil Service (Medical): No    Lack of Transportation (Non-Medical): No  Physical Activity: Sufficiently Active (03/19/2024)   Exercise Vital Sign    Days of Exercise per Week: 3 days    Minutes of Exercise per Session: 70 min  Stress: No Stress Concern Present (03/19/2024)   [employer] of Occupational Health - Occupational Stress Questionnaire    Feeling of Stress: Not at all  Social Connections: Not on file  Intimate Partner Violence: Not At Risk (03/19/2024)   Humiliation, Afraid, Rape, and Kick  questionnaire    Fear of Current or Ex-Partner: No    Emotionally Abused: No    Physically Abused: No    Sexually Abused: No    Review of Systems: See HPI, otherwise negative ROS  Physical Exam: BP 127/81   Pulse 77   Temp (!) 96.9 F (36.1 C) (Temporal)   Resp 16   Ht 5' 4 (1.626 m)   Wt 50.3 kg   SpO2 96%   BMI 19.05 kg/m  General:   Alert,  pleasant and cooperative in NAD Head:  Normocephalic and atraumatic. Neck:  Supple; no masses or thyromegaly. Lungs:  Clear throughout to auscultation.    Heart:  Regular rate and rhythm. Abdomen:  Soft, nontender and nondistended. Normal bowel sounds, without guarding, and without rebound.   Neurologic:  Alert and  oriented x4;  grossly normal neurologically.  Impression/Plan: Alexis Parks is here for an colonoscopy to be performed for a history of adenomatous polyps on 2023   Risks, benefits, limitations, and alternatives regarding  colonoscopy have been reviewed with the patient.  Questions have been answered.  All parties agreeable.   Rogelia Copping, MD  06/11/2024, 7:51 AM

## 2024-06-12 LAB — SURGICAL PATHOLOGY

## 2024-06-13 ENCOUNTER — Ambulatory Visit: Payer: Self-pay | Admitting: Gastroenterology

## 2024-07-31 ENCOUNTER — Ambulatory Visit
Admission: RE | Admit: 2024-07-31 | Discharge: 2024-07-31 | Disposition: A | Discharge status: Home / Self Care | Attending: Family Medicine | Admitting: Family Medicine

## 2024-07-31 ENCOUNTER — Ambulatory Visit
Admission: RE | Admit: 2024-07-31 | Discharge: 2024-07-31 | Disposition: A | Discharge status: Home / Self Care | Source: Ambulatory Visit | Attending: Family Medicine | Admitting: Family Medicine

## 2024-07-31 DIAGNOSIS — M79644 Pain in right finger(s): Secondary | ICD-10-CM | POA: Insufficient documentation

## 2024-09-11 ENCOUNTER — Encounter: Admitting: Family Medicine

## 2024-09-11 NOTE — Progress Notes (Deleted)
 "    Complete physical exam   Patient: Alexis Parks   DOB: 01/18/1961   63 y.o. Female  MRN: [id number] Visit Date: 09/11/2024  Today's healthcare provider: LAURAINE LOISE BUOY, DO   No chief complaint on file.  Subjective    Alexis Parks is a 63 y.o. female who presents today for a complete physical exam.  She reports consuming a {diet types:17450} diet. {Exercise:19826} She generally feels {well/fairly well/poorly:18703}. She reports sleeping {well/fairly well/poorly:18703}. She {does/does not:200015} have additional problems to discuss today.  HPI    ***  Past Medical History:  Diagnosis Date   Allergy    seasonal   Asthma    Cellulitis 12/23/2021   Hyperlipidemia    Hypertension    Past Surgical History:  Procedure Laterality Date   AUGMENTATION MAMMAPLASTY Bilateral 25+ yrs ago   silicone   COLONOSCOPY N/A 06/11/2024   Procedure: COLONOSCOPY;  Surgeon: Jinny Carmine, MD;  Location: [facility] ENDOSCOPY;  Service: Endoscopy;  Laterality: N/A;   COLONOSCOPY WITH PROPOFOL  N/A 06/01/2022   Procedure: COLONOSCOPY WITH PROPOFOL ;  Surgeon: Jinny Carmine, MD;  Location: [facility] ENDOSCOPY;  Service: Endoscopy;  Laterality: N/A;   EYE SURGERY     cataract surgery   POLYPECTOMY  06/11/2024   Procedure: POLYPECTOMY, INTESTINE;  Surgeon: Jinny Carmine, MD;  Location: [facility] ENDOSCOPY;  Service: Endoscopy;;   Social History   Socioeconomic History   Marital status: Divorced    Spouse name: Not on file   Number of children: Not on file   Years of education: Not on file   Highest education level: Not on file  Occupational History   Not on file  Tobacco Use   Smoking status: Every Day    Current packs/day: 0.75    Average packs/day: 0.8 packs/day for 42.0 years (31.5 ttl pk-yrs)    Types: Cigarettes   Smokeless tobacco: Never   Tobacco comments:    i am thinking about it decreased from 1 ppd to 3/4 ppd  Vaping Use   Vaping status: Never Used  Substance and Sexual Activity    Alcohol use: Yes    Alcohol/week: 11.0 standard drinks of alcohol    Types: 3 Glasses of wine, 2 Cans of beer, 6 Shots of liquor per week    Comment: 1-2 every other day; 3-4 on weekends   Drug use: No   Sexual activity: Yes    Birth control/protection: Post-menopausal  Other Topics Concern   Not on file  Social History Narrative   Not on file   Social Drivers of Health   Tobacco Use: High Risk (06/11/2024)   Patient History    Smoking Tobacco Use: Every Day    Smokeless Tobacco Use: Never    Passive Exposure: Not on file  Financial Resource Strain: Low Risk (03/19/2024)   Overall Financial Resource Strain (CARDIA)    Difficulty of Paying Living Expenses: Not hard at all  Food Insecurity: No Food Insecurity (03/19/2024)   Epic    Worried About Radiation Protection Practitioner of Food in the Last Year: Never true    Ran Out of Food in the Last Year: Never true  Transportation Needs: No Transportation Needs (03/19/2024)   Epic    Lack of Transportation (Medical): No    Lack of Transportation (Non-Medical): No  Physical Activity: Sufficiently Active (03/19/2024)   Exercise Vital Sign    Days of Exercise per Week: 3 days    Minutes of Exercise per Session: 70 min  Stress: No Stress Concern  Present (03/19/2024)   [employer] of Occupational Health - Occupational Stress Questionnaire    Feeling of Stress: Not at all  Social Connections: Not on file  Intimate Partner Violence: Not At Risk (03/19/2024)   Epic    Fear of Current or Ex-Partner: No    Emotionally Abused: No    Physically Abused: No    Sexually Abused: No  Depression (PHQ2-9): Medium Risk (03/19/2024)   Depression (PHQ2-9)    PHQ-2 Score: 6  Alcohol Screen: Low Risk (03/19/2024)   Alcohol Screen    Last Alcohol Screening Score (AUDIT): 2  Housing: Unknown (03/19/2024)   Epic    Unable to Pay for Housing in the Last Year: No    Number of Times Moved in the Last Year: Not on file    Homeless in the Last Year: No  Utilities: Not At  Risk (03/19/2024)   Epic    Threatened with loss of utilities: No  Health Literacy: Adequate Health Literacy (03/19/2024)   B1300 Health Literacy    Frequency of need for help with medical instructions: Never   Family Status  Relation Name Status   Mother  Alive   Father  Deceased   Sister  Alive   Sister  Alive   MGM  Deceased   MGF  Deceased   Neg Hx  (Not Specified)  No partnership data on file   Family History  Problem Relation Age of Onset   Hyperlipidemia Mother    Hypertension Mother    Thyroid disease Mother    Bone cancer Father    COPD Sister    COPD Sister    Restless legs syndrome Sister    CAD Maternal Grandmother    Diabetes Maternal Grandfather    Breast cancer Neg Hx    Allergies[1]  Patient Care Team: Ami Thornsberry, Lauraine SAILOR, DO as PCP - General (Family Medicine) Cindie Jesusa HERO, RN as Registered Nurse Dannielle Arlean FALCON, RN (Inactive) as Registered Nurse Sankar, Seeplaputhur G, MD (General Surgery)   Medications: Show/hide medication list[2]  Review of Systems {Insert previous labs (optional):23779} {See past labs  Heme  Chem  Endocrine  Serology  Results Review (optional):1}  Objective    There were no vitals taken for this visit. {Insert last BP/Wt (optional):23777}{See vitals history (optional):1}  Physical Exam    Last depression screening scores    03/19/2024    3:27 PM 05/14/2022    2:00 PM 12/23/2021    2:05 PM  PHQ 2/9 Scores  PHQ - 2 Score 0 0 0  PHQ- 9 Score 6        Data saved with a previous flowsheet row definition   Last fall risk screening    03/19/2024    3:26 PM  Fall Risk   Falls in the past year? 1  Number falls in past yr: 1  Injury with Fall? 0   Risk for fall due to : No Fall Risks     Data saved with a previous flowsheet row definition   Last Audit-C alcohol use screening    03/19/2024    3:25 PM  Alcohol Use Disorder Test (AUDIT)  1. How often do you have a drink containing alcohol? 0  2. How many drinks  containing alcohol do you have on a typical day when you are drinking? 2  3. How often do you have six or more drinks on one occasion? 0  AUDIT-C Score 2   A score of 3 or more in women,  and 4 or more in men indicates increased risk for alcohol abuse, EXCEPT if all of the points are from question 1   No results found for any visits on 09/11/24.  Assessment & Plan    Routine Health Maintenance and Physical Exam  Exercise Activities and Dietary recommendations  Goals      Quit Smoking        Immunization History  Administered Date(s) Administered   Influenza,inj,Quad PF,6+ Mos 07/09/2020   Influenza-Unspecified 09/20/2020   Moderna Sars-Covid-2 Vaccination 09/21/2019, 12/20/2019   Tdap 05/26/2014    Health Maintenance  Topic Date Due   HIV Screening  Never done   Hepatitis C Screening  Never done   Pneumococcal Vaccine: 50+ Years (1 of 2 - PCV) Never done   Zoster Vaccines- Shingrix (1 of 2) Never done   Cervical Cancer Screening (HPV/Pap Cotest)  10/13/2021   Lung Cancer Screening  02/03/2023   Influenza Vaccine  04/20/2024   COVID-19 Vaccine (3 - 2025-26 season) 05/21/2024   DTaP/Tdap/Td (2 - Td or Tdap) 05/26/2024   Mammogram  05/29/2026   Colonoscopy  06/11/2029   Hepatitis B Vaccines 19-59 Average Risk  Aged Out   HPV VACCINES  Aged Out   Meningococcal B Vaccine  Aged Out    Discussed health benefits of physical activity, and encouraged her to engage in regular exercise appropriate for her age and condition.   There are no diagnoses linked to this encounter.    ***  No follow-ups on file.     I discussed the assessment and treatment plan with the patient  The patient was provided an opportunity to ask questions and all were answered. The patient agreed with the plan and demonstrated an understanding of the instructions.   The patient was advised to call back or seek an in-person evaluation if the symptoms worsen or if the condition fails to improve as  anticipated.    LAURAINE LOISE BUOY, DO  [facility] [phone] (phone) [phone] (fax)  [facility]    [1] No Known Allergies [2]  Outpatient Medications Prior to Visit  Medication Sig   albuterol  (PROVENTIL  HFA) 108 (90 Base) MCG/ACT inhaler Inhale 2 puffs into the lungs every 6 (six) hours as needed for wheezing or shortness of breath   cetirizine  (ZYRTEC ) 10 MG tablet Take 1/2 tablet (5 mg total) by mouth once daily. (Patient not taking: Reported on 03/19/2024)   fluticasone -salmeterol (ADVAIR ) 100-50 MCG/ACT AEPB Inhale 1 puff into the lungs 2 (two) times daily.   predniSONE  (DELTASONE ) 20 MG tablet Take 60mg  PO daily x 2 days, then40mg  PO daily x 2 days, then 20mg  PO daily x 3 days (Patient not taking: Reported on 06/11/2024)   No facility-administered medications prior to visit.   "

## 2024-10-09 ENCOUNTER — Encounter: Admitting: Family Medicine

## 2024-10-09 DIAGNOSIS — Z23 Encounter for immunization: Secondary | ICD-10-CM

## 2024-10-09 DIAGNOSIS — Z124 Encounter for screening for malignant neoplasm of cervix: Secondary | ICD-10-CM

## 2024-10-24 ENCOUNTER — Other Ambulatory Visit: Payer: Self-pay

## 2024-10-24 ENCOUNTER — Ambulatory Visit: Admitting: Family Medicine

## 2024-10-24 ENCOUNTER — Encounter: Payer: Self-pay | Admitting: Family Medicine

## 2024-10-24 VITALS — BP 130/84 | HR 99 | Resp 14 | Ht 64.0 in | Wt 115.6 lb

## 2024-10-24 DIAGNOSIS — R030 Elevated blood-pressure reading, without diagnosis of hypertension: Secondary | ICD-10-CM

## 2024-10-24 DIAGNOSIS — R748 Abnormal levels of other serum enzymes: Secondary | ICD-10-CM

## 2024-10-24 DIAGNOSIS — F17200 Nicotine dependence, unspecified, uncomplicated: Secondary | ICD-10-CM

## 2024-10-24 DIAGNOSIS — Z1159 Encounter for screening for other viral diseases: Secondary | ICD-10-CM

## 2024-10-24 DIAGNOSIS — Z122 Encounter for screening for malignant neoplasm of respiratory organs: Secondary | ICD-10-CM

## 2024-10-24 DIAGNOSIS — D751 Secondary polycythemia: Secondary | ICD-10-CM

## 2024-10-24 DIAGNOSIS — H538 Other visual disturbances: Secondary | ICD-10-CM

## 2024-10-24 DIAGNOSIS — I77819 Aortic ectasia, unspecified site: Secondary | ICD-10-CM

## 2024-10-24 DIAGNOSIS — Z136 Encounter for screening for cardiovascular disorders: Secondary | ICD-10-CM

## 2024-10-24 DIAGNOSIS — D582 Other hemoglobinopathies: Secondary | ICD-10-CM

## 2024-10-24 DIAGNOSIS — Z114 Encounter for screening for human immunodeficiency virus [HIV]: Secondary | ICD-10-CM

## 2024-10-24 DIAGNOSIS — J302 Other seasonal allergic rhinitis: Secondary | ICD-10-CM

## 2024-10-24 DIAGNOSIS — R5382 Chronic fatigue, unspecified: Secondary | ICD-10-CM

## 2024-10-24 DIAGNOSIS — J45909 Unspecified asthma, uncomplicated: Secondary | ICD-10-CM

## 2024-10-24 DIAGNOSIS — Z23 Encounter for immunization: Secondary | ICD-10-CM

## 2024-10-24 DIAGNOSIS — Z0000 Encounter for general adult medical examination without abnormal findings: Secondary | ICD-10-CM

## 2024-10-24 DIAGNOSIS — Z87891 Personal history of nicotine dependence: Secondary | ICD-10-CM

## 2024-10-24 DIAGNOSIS — F17210 Nicotine dependence, cigarettes, uncomplicated: Secondary | ICD-10-CM

## 2024-10-24 MED ORDER — MELOXICAM 7.5 MG PO TABS: 7.5000 mg | ORAL_TABLET | Freq: Every day | ORAL | 3 refills | Status: AC | PRN

## 2024-10-24 MED ORDER — FLUTICASONE PROPIONATE 50 MCG/ACT NA SUSP: 1.0000 | Freq: Every day | NASAL | 6 refills | Status: AC

## 2024-10-24 MED ORDER — CETIRIZINE HCL 10 MG PO TABS: 5.0000 mg | ORAL_TABLET | Freq: Every day | ORAL | 3 refills | Status: AC

## 2024-10-24 NOTE — Progress Notes (Unsigned)
 "    Complete physical exam   Patient: Alexis Parks   DOB: Feb 10, 1961   64 y.o. Female  MRN: [id number] Visit Date: 10/24/2024  Today's healthcare provider: LAURAINE LOISE BUOY, DO   Chief Complaint  Patient presents with   Annual Exam   Eye Problem    R eye surgery and has not been able to see out it recently   Tinnitus    R ear ringing x years    Hand Injury    R hand injury/arthritis due to swelling    Subjective    Alexis Parks is a 64 y.o. female who presents today for a complete physical exam.   Eye Problem  Pertinent negatives include no eye redness, fever, nausea or weakness.  Hand Injury  Pertinent negatives include no chest pain or numbness.   HPI     Eye Problem    Additional comments: R eye surgery and has not been able to see out it recently        Tinnitus    Additional comments: R ear ringing x years         Hand Injury    Additional comments: R hand injury/arthritis due to swelling       Last edited by Wilfred Hargis RAMAN, CMA on 10/24/2024  3:15 PM.       Alexis Parks is a 64 year old female who presents with concerns about her eye, hand pain, and hearing issues.  She has concerns regarding her right eye, which underwent cataract surgery approximately 30 years ago. Her vision is blurred and has progressively worsened over time. An eye doctor mentioned that the lens had split down the middle about three years ago, with a concern for glaucoma. She has not had a follow-up since then and reports difficulty distinguishing colors and concerns about her ability to drive.  She experiences persistent pain in her right hand, particularly at the base of the thumb, which is severe enough to bring her to her knees during certain activities. X-ray done in November 2025 showed arthritis.  She reports a change in her right ear, which previously had a high-pitched squealing noise that has now transitioned to periods of silence, akin to the  sensation of being on an airplane. No dizziness or lightheadedness, but there is occasional quieting of hearing in the right ear.  She mentions chronic fatigue and poor sleep, sleeping only a couple of hours at a time and feeling tired throughout the day. She attributes some of her fatigue to inadequate water intake. She uses Zyrtec  at half a tablet and has a couple of inhalers (advair  and albuterol ), using two sprays as needed. She smokes and has tried various herbs to aid her sleep.    Past Medical History:  Diagnosis Date   Allergy    seasonal   Asthma    Cellulitis 12/23/2021   Hyperlipidemia    Hypertension    Past Surgical History:  Procedure Laterality Date   AUGMENTATION MAMMAPLASTY Bilateral 25+ yrs ago   silicone   COLONOSCOPY N/A 06/11/2024   Procedure: COLONOSCOPY;  Surgeon: Jinny Carmine, MD;  Location: [facility] ENDOSCOPY;  Service: Endoscopy;  Laterality: N/A;   COLONOSCOPY WITH PROPOFOL  N/A 06/01/2022   Procedure: COLONOSCOPY WITH PROPOFOL ;  Surgeon: Jinny Carmine, MD;  Location: [facility] ENDOSCOPY;  Service: Endoscopy;  Laterality: N/A;   EYE SURGERY     cataract surgery   POLYPECTOMY  06/11/2024   Procedure: POLYPECTOMY, INTESTINE;  Surgeon: Jinny Carmine,  MD;  Location: [facility] ENDOSCOPY;  Service: Endoscopy;;   Social History   Socioeconomic History   Marital status: Divorced    Spouse name: Not on file   Number of children: Not on file   Years of education: Not on file   Highest education level: Not on file  Occupational History   Not on file  Tobacco Use   Smoking status: Every Day    Current packs/day: 0.75    Average packs/day: 0.8 packs/day for 42.0 years (31.5 ttl pk-yrs)    Types: Cigarettes   Smokeless tobacco: Never   Tobacco comments:    i am thinking about it decreased from 1 ppd to 3/4 ppd  Vaping Use   Vaping status: Never Used  Substance and Sexual Activity   Alcohol use: Yes    Alcohol/week: 11.0 standard drinks of alcohol    Types: 3 Glasses of  wine, 2 Cans of beer, 6 Shots of liquor per week    Comment: 1-2 every other day; 3-4 on weekends   Drug use: No   Sexual activity: Yes    Birth control/protection: Post-menopausal  Other Topics Concern   Not on file  Social History Narrative   Not on file   Social Drivers of Health   Tobacco Use: High Risk (10/24/2024)   Patient History    Smoking Tobacco Use: Every Day    Smokeless Tobacco Use: Never    Passive Exposure: Not on file  Financial Resource Strain: Low Risk (03/19/2024)   Overall Financial Resource Strain (CARDIA)    Difficulty of Paying Living Expenses: Not hard at all  Food Insecurity: No Food Insecurity (03/19/2024)   Epic    Worried About Radiation Protection Practitioner of Food in the Last Year: Never true    Ran Out of Food in the Last Year: Never true  Transportation Needs: No Transportation Needs (03/19/2024)   Epic    Lack of Transportation (Medical): No    Lack of Transportation (Non-Medical): No  Physical Activity: Sufficiently Active (03/19/2024)   Exercise Vital Sign    Days of Exercise per Week: 3 days    Minutes of Exercise per Session: 70 min  Stress: No Stress Concern Present (03/19/2024)   [employer] of Occupational [employer] - Occupational Stress Questionnaire    Feeling of Stress: Not at all  Social Connections: Not on file  Intimate Partner Violence: Not At Risk (03/19/2024)   Epic    Fear of Current or Ex-Partner: No    Emotionally Abused: No    Physically Abused: No    Sexually Abused: No  Depression (PHQ2-9): Medium Risk (10/24/2024)   Depression (PHQ2-9)    PHQ-2 Score: 6  Alcohol Screen: Low Risk (03/19/2024)   Alcohol Screen    Last Alcohol Screening Score (AUDIT): 2  Housing: Unknown (03/19/2024)   Epic    Unable to Pay for Housing in the Last Year: No    Number of Times Moved in the Last Year: Not on file    Homeless in the Last Year: No  Utilities: Not At Risk (03/19/2024)   Epic    Threatened with loss of utilities: No  Health Literacy: Adequate  Health Literacy (03/19/2024)   B1300 Health Literacy    Frequency of need for help with medical instructions: Never   Family Status  Relation Name Status   Mother  Alive   Father  Deceased   Sister  Alive   Sister  Alive   MGM  Deceased   MGF  Deceased   Neg Hx  (Not Specified)  No partnership data on file   Family History  Problem Relation Age of Onset   Hyperlipidemia Mother    Hypertension Mother    Thyroid disease Mother    Bone cancer Father    COPD Sister    COPD Sister    Restless legs syndrome Sister    CAD Maternal Grandmother    Diabetes Maternal Grandfather    Breast cancer Neg Hx    Allergies[1]  Patient Care Team: Beauregard Jarrells, Lauraine SAILOR, DO as PCP - General (Family Medicine) Cindie Jesusa HERO, RN as Registered Nurse Dannielle Arlean FALCON, RN (Inactive) as Registered Nurse Sankar, Seeplaputhur G, MD (General Surgery)   Medications: Show/hide medication list[2]  Review of Systems  Constitutional:  Positive for fatigue. Negative for chills and fever.  HENT:  Positive for hearing loss (right ear). Negative for congestion, ear pain, rhinorrhea, sneezing and sore throat.   Eyes:  Positive for visual disturbance (blurry vision right eye). Negative for pain and redness.  Respiratory:  Negative for cough, shortness of breath and wheezing.   Cardiovascular:  Negative for chest pain and leg swelling.  Gastrointestinal:  Negative for abdominal pain, blood in stool, constipation, diarrhea and nausea.  Endocrine: Negative for polydipsia and polyphagia.  Genitourinary: Negative.  Negative for dysuria, flank pain, hematuria, pelvic pain, vaginal bleeding and vaginal discharge.  Musculoskeletal:  Negative for arthralgias, back pain, gait problem and joint swelling.  Skin:  Negative for rash.  Neurological: Negative.  Negative for dizziness, tremors, seizures, weakness, light-headedness, numbness and headaches.  Hematological:  Negative for adenopathy.  Psychiatric/Behavioral:  Negative.  Negative for behavioral problems, confusion and dysphoric mood. The patient is not nervous/anxious and is not hyperactive.    {Insert previous labs (optional):23779} {See past labs  Heme  Chem  Endocrine  Serology  Results Review (optional):1}  Objective    BP 130/84   Pulse 99   Resp 14   Ht 5' 4 (1.626 m)   Wt 115 lb 9.6 oz (52.4 kg)   SpO2 94%   BMI 19.84 kg/m  {Insert last BP/Wt (optional):23777}{See vitals history (optional):1}  Physical Exam Vitals and nursing note reviewed.  Constitutional:      General: She is awake.     Appearance: Normal appearance.  HENT:     Head: Normocephalic and atraumatic.     Right Ear: Tympanic membrane, ear canal and external ear normal.     Left Ear: Tympanic membrane, ear canal and external ear normal.     Nose: Nose normal.     Mouth/Throat:     Mouth: Mucous membranes are moist.     Pharynx: Oropharynx is clear. No oropharyngeal exudate or posterior oropharyngeal erythema.  Eyes:     General: No scleral icterus.    Extraocular Movements: Extraocular movements intact.     Conjunctiva/sclera: Conjunctivae normal.     Pupils: Pupils are equal, round, and reactive to light.  Neck:     Thyroid: No thyromegaly or thyroid tenderness.  Cardiovascular:     Rate and Rhythm: Normal rate and regular rhythm.     Pulses: Normal pulses.     Heart sounds: Normal heart sounds.  Pulmonary:     Effort: Pulmonary effort is normal. No tachypnea, bradypnea or respiratory distress.     Breath sounds: No stridor. Wheezing present. No rhonchi or rales.  Abdominal:     General: Bowel sounds are normal. There is no distension.     Palpations: Abdomen  is soft. There is no mass.     Tenderness: There is no abdominal tenderness. There is no guarding.     Hernia: No hernia is present.  Musculoskeletal:     Cervical back: Normal range of motion and neck supple.     Right lower leg: No edema.     Left lower leg: No edema.  Lymphadenopathy:      Cervical: No cervical adenopathy.  Skin:    General: Skin is warm and dry.  Neurological:     Mental Status: She is alert and oriented to person, place, and time. Mental status is at baseline.  Psychiatric:        Mood and Affect: Mood normal.        Behavior: Behavior normal.      Last depression screening scores    10/24/2024    3:16 PM 03/19/2024    3:27 PM 05/14/2022    2:00 PM  PHQ 2/9 Scores  PHQ - 2 Score 0 0 0  PHQ- 9 Score 6 6       Data saved with a previous flowsheet row definition   Last fall risk screening    10/24/2024    3:16 PM  Fall Risk   Falls in the past year? 0  Number falls in past yr: 0  Injury with Fall? 0  Risk for fall due to : No Fall Risks  Follow up Falls evaluation completed   Last Audit-C alcohol use screening    03/19/2024    3:25 PM  Alcohol Use Disorder Test (AUDIT)  1. How often do you have a drink containing alcohol? 0  2. How many drinks containing alcohol do you have on a typical day when you are drinking? 2  3. How often do you have six or more drinks on one occasion? 0  AUDIT-C Score 2   A score of 3 or more in women, and 4 or more in men indicates increased risk for alcohol abuse, EXCEPT if all of the points are from question 1   No results found for any visits on 10/24/24.  Assessment & Plan    Routine Health Maintenance and Physical Exam  Exercise Activities and Dietary recommendations  Goals      Quit Smoking        Immunization History  Administered Date(s) Administered   Influenza,inj,Quad PF,6+ Mos 07/09/2020   Influenza-Unspecified 09/20/2020   Moderna Sars-Covid-2 Vaccination 09/21/2019, 12/20/2019, 02/11/2020, 03/17/2020   Tdap 05/26/2014    Health Maintenance  Topic Date Due   HIV Screening  Never done   Hepatitis C Screening  Never done   Cervical Cancer Screening (HPV/Pap Cotest)  10/13/2021   Lung Cancer Screening  02/03/2023   Influenza Vaccine  12/18/2024 (Originally 04/20/2024)    DTaP/Tdap/Td (2 - Td or Tdap) 12/19/2024 (Originally 05/26/2024)   Zoster Vaccines- Shingrix (1 of 2) 01/21/2025 (Originally 10/01/2012)   COVID-19 Vaccine (5 - 2025-26 season) 05/20/2025 (Originally 05/21/2024)   Pneumococcal Vaccine: 50+ Years (1 of 2 - PCV) 10/24/2025 (Originally 10/01/1981)   Mammogram  05/29/2026   Colonoscopy  06/11/2029   HPV VACCINES (No Doses Required) Completed   Hepatitis B Vaccines 19-59 Average Risk  Aged Out   Meningococcal B Vaccine  Aged Out    Discussed health benefits of physical activity, and encouraged her to engage in regular exercise appropriate for her age and condition.   Annual physical exam  Ectatic aorta  Moderate asthma, unspecified whether complicated, unspecified whether persistent  Polycythemia -  CBC  Elevated liver enzymes -     Comprehensive metabolic panel with GFR  Elevated blood-pressure reading without diagnosis of hypertension  Encounter for screening for HIV -     HIV Antibody (routine testing w rflx)  Encounter for hepatitis C screening test for low risk patient -     HCV Ab w Reflex to Quant PCR  Nicotine dependence with current use -     Ambulatory Referral for Lung Cancer Scre  Encounter for screening for cardiovascular disorders -     Lipid panel  Blurry vision, right eye -     Ambulatory referral to Ophthalmology  Chronic fatigue -     Vitamin B12 -     VITAMIN D  25 Hydroxy (Vit-D Deficiency, Fractures) -     Iron, TIBC and Ferritin Panel  Seasonal allergies -     Cetirizine  HCl; Take 1/2 tablet (5 mg total) by mouth once daily.  Dispense: 45 tablet; Refill: 3 -     Fluticasone  Propionate; Place 1-2 sprays into both nostrils daily.  Dispense: 16 g; Refill: 6  Other orders -     Meloxicam ; Take 1 tablet (7.5 mg total) by mouth daily as needed for pain.  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 -  Referred to ophthalmology for further evaluation and management.  Osteoarthritis of the right hand Chronic osteoarthritis with severe pain impacting daily activities. - Prescribed meloxicam  daily for 3-5 days, then as needed. - Recommended topical gels to be compared for symptomatic relief.  Asthma Intermittent wheezing due to inconsistent inhaler use. - Instructed to use Advair  inhaler daily. - Sent referral for lung cancer screening.  Seasonal allergic rhinitis Managed with Zyrtec  and Flonase  for nasal congestion and hearing disturbance. - Refilled Zyrtec  prescription. - Prescribed Flonase  for nasal congestion and potential hearing issues.  Hearing disturbance, right ear Intermittent muffled hearing, potential fluid in ear canal. - Prescribed Flonase  for potential fluid in ear canal. - Consider referral to audiology if symptoms persist.  Chronic fatigue - Encouraged consistent use of Advair  inhaler to improve fatigue.  Nicotine dependence, current use Current smoker with desire to quit but difficulty in doing so.  General Health Maintenance Discussed vaccinations for shingles, tetanus, and pneumonia. - Recommended shingles vaccine, available at clinic or pharmacy. - Discussed tetanus and pneumonia vaccines, available at clinic or pharmacy. ***  Return in about 4 weeks (around 11/21/2024) for Pap.     I discussed the assessment and treatment plan with the patient  The patient was provided an opportunity to ask questions and all were answered. The patient agreed with the plan and demonstrated an understanding of the instructions.   The patient was advised to call back or seek an in-person evaluation if the symptoms worsen or if the condition fails to improve as anticipated.    LAURAINE LOISE BUOY, DO  [facility] [phone] (phone) [phone] (fax)  [facility]    [1] No Known Allergies [2]  Outpatient Medications Prior to  Visit  Medication Sig   albuterol  (PROVENTIL  HFA) 108 (90 Base) MCG/ACT inhaler Inhale 2 puffs into the lungs every 6 (six) hours as needed for wheezing or shortness of breath   fluticasone -salmeterol (ADVAIR ) 100-50 MCG/ACT AEPB Inhale 1 puff into the lungs 2 (two) times daily.   [DISCONTINUED] cetirizine  (ZYRTEC ) 10 MG tablet Take 1/2 tablet (5 mg total) by mouth once  daily.   [DISCONTINUED] predniSONE  (DELTASONE ) 20 MG tablet Take 60mg  PO daily x 2 days, then40mg  PO daily x 2 days, then 20mg  PO daily x 3 days (Patient not taking: Reported on 06/11/2024)   No facility-administered medications prior to visit.   "
